# Patient Record
Sex: Female | Born: 1990 | Race: White | Hispanic: No | Marital: Single | Smoking: Never smoker
Health system: Southern US, Community
[De-identification: ages and names within clinical notes are randomized; demographics above are authoritative.]

## PROBLEM LIST (undated history)

## (undated) DIAGNOSIS — G43909 Migraine, unspecified, not intractable, without status migrainosus: Secondary | ICD-10-CM

## (undated) DIAGNOSIS — F419 Anxiety disorder, unspecified: Secondary | ICD-10-CM

## (undated) DIAGNOSIS — F5000 Anorexia nervosa, unspecified: Secondary | ICD-10-CM

## (undated) DIAGNOSIS — F32A Depression, unspecified: Secondary | ICD-10-CM

## (undated) DIAGNOSIS — F329 Major depressive disorder, single episode, unspecified: Secondary | ICD-10-CM

## (undated) HISTORY — DX: Anxiety disorder, unspecified: F41.9

## (undated) HISTORY — DX: Migraine, unspecified, not intractable, without status migrainosus: G43.909

## (undated) HISTORY — DX: Anorexia nervosa, unspecified: F50.00

---
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
## 2016-03-13 ENCOUNTER — Ambulatory Visit (INDEPENDENT_AMBULATORY_CARE_PROVIDER_SITE_OTHER): Payer: 59 | Admitting: Osteopathic Medicine

## 2016-03-13 ENCOUNTER — Encounter: Payer: Self-pay | Admitting: Osteopathic Medicine

## 2016-03-13 VITALS — BP 122/73 | HR 98 | Ht 64.0 in | Wt 125.0 lb

## 2016-03-13 DIAGNOSIS — F330 Major depressive disorder, recurrent, mild: Secondary | ICD-10-CM | POA: Diagnosis not present

## 2016-03-13 DIAGNOSIS — F411 Generalized anxiety disorder: Secondary | ICD-10-CM | POA: Insufficient documentation

## 2016-03-13 DIAGNOSIS — R002 Palpitations: Secondary | ICD-10-CM | POA: Insufficient documentation

## 2016-03-13 LAB — CBC WITH DIFFERENTIAL/PLATELET
BASOS ABS: 0 {cells}/uL (ref 0–200)
Basophils Relative: 0 %
Eosinophils Absolute: 43 cells/uL (ref 15–500)
Eosinophils Relative: 1 %
HEMATOCRIT: 42.2 % (ref 35.0–45.0)
HEMOGLOBIN: 14.2 g/dL (ref 11.7–15.5)
LYMPHS ABS: 1376 {cells}/uL (ref 850–3900)
LYMPHS PCT: 32 %
MCH: 30.1 pg (ref 27.0–33.0)
MCHC: 33.6 g/dL (ref 32.0–36.0)
MCV: 89.4 fL (ref 80.0–100.0)
MONO ABS: 473 {cells}/uL (ref 200–950)
MPV: 10 fL (ref 7.5–12.5)
Monocytes Relative: 11 %
NEUTROS PCT: 56 %
Neutro Abs: 2408 cells/uL (ref 1500–7800)
Platelets: 321 10*3/uL (ref 140–400)
RBC: 4.72 MIL/uL (ref 3.80–5.10)
RDW: 12.8 % (ref 11.0–15.0)
WBC: 4.3 10*3/uL (ref 3.8–10.8)

## 2016-03-13 LAB — TSH: TSH: 0.87 m[IU]/L

## 2016-03-13 MED ORDER — BUPROPION HCL ER (XL) 150 MG PO TB24: 150.0000 mg | ORAL_TABLET | Freq: Every day | ORAL | 1 refills | Status: DC

## 2016-03-13 MED ORDER — ALPRAZOLAM 0.5 MG PO TABS: 0.2500 mg | ORAL_TABLET | Freq: Every day | ORAL | 0 refills | Status: DC | PRN

## 2016-03-13 MED FILL — BUPROPION HCL XL 150 MG TAB: 150 | 30 days supply | Qty: 30 | Fill #0

## 2016-03-13 NOTE — Patient Instructions (Signed)
I'll try to reach you in 2 weeks by phone to discuss how you're doing on the medicine, and let's plan to follow-up in the office in 4 - 6 weeks. Any questions or concerns before then, please call or message me!   Take care! -Dr. Loni Muse.

## 2016-03-13 NOTE — Progress Notes (Signed)
HPI: Jennifer Levy is a 25 y.o. female  who presents to [facility] today, 03/13/16,  for chief complaint of:  Chief Complaint  Patient presents with  . Establish Care    anxiety    New patient here to establish care. Recently moved here for work after [place] in [place]. Has been here since January. Previous treatment with Xanax as needed, finds that she is using this about 3 times per week or so. Out of this medicine. Like to discuss options for anxiety/depression and [place]. Previous medications include the following: Seroquel - was on this when she was younger, not sue why it was stopped. Zoloft - problems with libido.   Generalized anxiety disorder evaluation: Excessive anxiety/worry 6 mos+: Yes  Home and work/school: Yes  Difficulty to control: Yes  Associated symptoms (3/adult, 1/child):     Restlessness/on edge Yes      Fatigue Yes      Concentration Yes      Irritable Yes      Muscle tension No      Sleep disturbance Yes  Imparled social/occupational functioning: Yes  Any secondary cause or other concerns?   Major depression Concern  Personality d/o No concern  Social anxiety d/o No concern  Obsessive Compulsive d/o No concern  PTSD No concern  Eating d/o or Body Dysmorphic d/o Concern - hx anorexia nervosa   Major Depression Evaluation: One of the following for 2wk, most of the day on most days?  Depression: Yes    Anhedonia/reduced interest: Yes  Four of the following, most of the day on most days?  Weight change unintentional: No   Sleep pattern change: Yes   Psychomotor symptom: No   Fatigue: Yes   Worthlessness/guilt: Yes   Concentration problems: Yes   Thoughts of death/suicide: Yes  Concern for other causes?  Drug-induced problems: No   Bipolar disorder: No - MDQ negative 03/13/16   Delusional: No   Hallucinations: No   Bereavement/grief: No   Hypothyroid: labs pending  Alcohol: pt denies    Past  medical, surgical, social and family history reviewed: Past Medical History:  Diagnosis Date  . Anorexia nervosa   . Anxiety   . Migraine    Past Surgical History:  Procedure Laterality Date  . TMJ ARTHROSCOPY  2012  . WISDOM TOOTH EXTRACTION  2011   Social History  Substance Use Topics  . Smoking status: Never Smoker  . Smokeless tobacco: Never Used  . Alcohol use Not on file   No family history on file.   Current medication list and allergy/intolerance information reviewed:   Current Outpatient Prescriptions  Medication Sig Dispense Refill  . ALPRAZolam (XANAX) 0.5 MG tablet Take 0.5-1 tablets (0.25-0.5 mg total) by mouth daily as needed for anxiety (use sparingly to prevent tolerance/dependence). #30 tablets for 75-month supply 30 tablet 0  . SPRINTEC 28 0.25-35 MG-MCG tablet Take 1 tablet by mouth daily.  8  . buPROPion (WELLBUTRIN XL) 150 MG 24 hr tablet Take 1 tablet (150 mg total) by mouth daily. 30 tablet 1   No current facility-administered medications for this visit.    Allergies not on file    Review of Systems:  Constitutional:  No  fever, no chills, No recent illness, No unintentional weight changes. No significant fatigue.   HEENT: +occasional headache, no vision change, no hearing change, No sore throat, No  sinus pressure  Cardiac: No  chest pain, No  pressure, +occasional palpitations, No  Orthopnea  Respiratory:  No  shortness of breath. No  Cough  Gastrointestinal: No  abdominal pain, No  nausea, No  vomiting,  No  blood in stool, No  diarrhea, No  constipation   Musculoskeletal: No new myalgia/arthralgia  Genitourinary: No  incontinence, No  abnormal genital bleeding, No abnormal genital discharge  Skin: No  Rash, No other wounds/concerning lesions  Hem/Onc: No  easy bruising/bleeding, No  abnormal lymph node  Endocrine: No cold intolerance,  No heat intolerance. No polyuria/polydipsia/polyphagia   Neurologic: No  weakness, No  dizziness, No   slurred speech/focal weakness/facial droop  Psychiatric: +concerns with depression, +concerns with anxiety, +sleep problems, No mood problems  Exam:  BP 122/73   Pulse 98   Ht 5\' 4"  (D34-534 m)   Wt 125 lb (56.7 kg)   BMI 21.46 kg/m   Constitutional: VS see above. General Appearance: alert, well-developed, well-nourished, NAD  Eyes: Normal lids and conjunctive, non-icteric sclera  Ears, Nose, Mouth, Throat: MMM, Normal external inspection ears/nares/mouth/lips/gums.   Neck: No masses, trachea midline. No thyroid enlargement. No tenderness/mass appreciated. No lymphadenopathy  Respiratory: Normal respiratory effort. no wheeze, no rhonchi, no rales  Cardiovascular: S1/S2 normal, no murmur, no rub/gallop auscultated. RRR. No lower extremity edema.  Gastrointestinal: Nontender, no masses. No hepatomegaly, no splenomegaly. No hernia appreciated. Bowel sounds normal. Rectal exam deferred.   Musculoskeletal: Gait normal.   Neurological: Normal balance/coordination. No tremor.   Skin: warm, dry, intact. No rash/ulcer.   Psychiatric: Normal judgment/insight. Normal mood and affect. Oriented x3. No suicidal ideation. No thought disorder.   Depression screen PHQ 2/9 03/13/2016  Decreased Interest 1  Down, Depressed, Hopeless 1  PHQ - 2 Score 2  Altered sleeping 2  Tired, decreased energy 1  Change in appetite 0  Feeling bad or failure about yourself  2  Trouble concentrating 1  Moving slowly or fidgety/restless 0  Suicidal thoughts 1  PHQ-9 Score 9   GAD 7 : Generalized Anxiety Score 03/13/2016  Nervous, Anxious, on Edge 2  Control/stop worrying 1  Worry too much - different things 1  Trouble relaxing 2  Restless 1  Easily annoyed or irritable 1  Afraid - awful might happen 2  Total GAD 7 Score 10    EKG interpretation: Rate: 90  Rhythm: sinus No ST/T changes concerning for acute ischemia/infarct    ASSESSMENT/PLAN:   No concerns on EKG, palpitations with anxiety  type symptoms.  Given previous side effects on SSRI and patient concern for weight gain, will trial Wellbutrin.  Patient has been educated on significant possible side effects of medication and is instructed to contact me or other medical professional with any concerns about side effects.    Generalized anxiety disorder - Plan: CBC with Differential/Platelet, COMPLETE METABOLIC PANEL WITH GFR, TSH, buPROPion (WELLBUTRIN XL) 150 MG 24 hr tablet, ALPRAZolam (XANAX) 0.5 MG tablet  Mild episode of recurrent major depressive disorder ([place]) - Plan: buPROPion (WELLBUTRIN XL) 150 MG 24 hr tablet  Intermittent palpitations    Patient Instructions  I'll try to reach you in 2 weeks by phone to discuss how you're doing on the medicine, and let's plan to follow-up in the office in 4 - 6 weeks. Any questions or concerns before then, please call or message me!   Take care! -Dr. Loni Muse.    Visit summary with medication list and pertinent instructions was printed for patient to review. All questions at time of visit were answered - patient instructed to contact office with  any additional concerns. ER/RTC precautions were reviewed with the patient. Follow-up plan: Return in about 6 weeks (around 04/24/2016) for FOLLOW-UP ON ANXIETY/MEDICATIONS, sooner if needed.

## 2016-03-14 LAB — COMPLETE METABOLIC PANEL WITH GFR
ALBUMIN: 4.1 g/dL (ref 3.6–5.1)
ALK PHOS: 46 U/L (ref 33–115)
ALT: 19 U/L (ref 6–29)
AST: 15 U/L (ref 10–30)
BUN: 10 mg/dL (ref 7–25)
CALCIUM: 9 mg/dL (ref 8.6–10.2)
CO2: 22 mmol/L (ref 20–31)
Chloride: 105 mmol/L (ref 98–110)
Creat: 0.68 mg/dL (ref 0.50–1.10)
Glucose, Bld: 92 mg/dL (ref 65–99)
POTASSIUM: 4.3 mmol/L (ref 3.5–5.3)
Sodium: 137 mmol/L (ref 135–146)
Total Bilirubin: 0.5 mg/dL (ref 0.2–1.2)
Total Protein: 6.5 g/dL (ref 6.1–8.1)

## 2016-03-15 NOTE — Addendum Note (Signed)
Addended by: Huel Cote on: 03/15/2016 04:51 PM   Modules accepted: Orders

## 2016-03-16 MED FILL — ALPRAZolam 0.5 MG TABS: 0.5 | 90 days supply | Qty: 30 | Fill #0

## 2016-03-18 ENCOUNTER — Encounter: Payer: Self-pay | Admitting: Osteopathic Medicine

## 2016-04-17 MED FILL — BUPROPION HCL XL 150 MG TAB: 150 | 30 days supply | Qty: 30 | Fill #1

## 2016-04-18 ENCOUNTER — Encounter: Payer: Self-pay | Admitting: Osteopathic Medicine

## 2016-04-18 ENCOUNTER — Ambulatory Visit (INDEPENDENT_AMBULATORY_CARE_PROVIDER_SITE_OTHER): Payer: 59 | Admitting: Osteopathic Medicine

## 2016-04-18 DIAGNOSIS — F411 Generalized anxiety disorder: Secondary | ICD-10-CM | POA: Diagnosis not present

## 2016-04-18 DIAGNOSIS — F330 Major depressive disorder, recurrent, mild: Secondary | ICD-10-CM | POA: Diagnosis not present

## 2016-04-18 MED ORDER — BUPROPION HCL ER (XL) 150 MG PO TB24: 150.0000 mg | ORAL_TABLET | Freq: Every day | ORAL | 1 refills | Status: DC

## 2016-04-18 NOTE — Patient Instructions (Signed)
Let me know what dose you'd like to continue - can try going up to 300 mg or stay at 150 mg - message or call me!

## 2016-04-18 NOTE — Progress Notes (Signed)
HPI: Jennifer Levy is a 25 y.o. female  who presents to [facility] today, 04/18/16,  for chief complaint of:  No chief complaint on file.   Established care 03/13/16 - Recently moved here for work after [place] in [place]. Has been here since January. Previous treatment with Xanax as needed, finds that she is using this about 3 times per week or so. Out of this medicine. Like to discuss options for anxiety/depression. Previous medications include the following: Seroquel - was on this when she was younger, not sure why it was stopped. Zoloft - problems with libido. MDQ negative at that visit.  Interim history: 04/18/16 last visit refilled Xanax #30 x3 months. Initiated Wellbutrin, Pt was experiencing some eye twitches shortly after starting the medication but this resolved spontaneously. Patient showed improvement in self-report of symptoms, see below for anxiety/depression scales GAD7 & PHQ9, she is finding that she is needing the Xanax a bit less frequently. Tolerating the medication better than she was able to take Seroquel and Zoloft in the past.    Past medical, surgical, social and family history reviewed: Past Medical History:  Diagnosis Date  . Anorexia nervosa   . Anxiety   . Migraine    Past Surgical History:  Procedure Laterality Date  . TMJ ARTHROSCOPY  2012  . WISDOM TOOTH EXTRACTION  2011   Social History  Substance Use Topics  . Smoking status: Never Smoker  . Smokeless tobacco: Never Used  . Alcohol use Not on file   Family History  Problem Relation Age of Onset  . Depression Mother   . Alcohol abuse Brother   . Stroke Maternal Aunt   . Alcohol abuse Maternal Uncle   . Cancer Maternal Grandfather     breast  . Diabetes Maternal Grandfather      Current medication list and allergy/intolerance information reviewed:   Current Outpatient Prescriptions  Medication Sig Dispense Refill  . ALPRAZolam (XANAX) 0.5 MG  tablet Take 0.5-1 tablets (0.25-0.5 mg total) by mouth daily as needed for anxiety (use sparingly to prevent tolerance/dependence). #30 tablets for 12-month supply 30 tablet 0  . buPROPion (WELLBUTRIN XL) 150 MG 24 hr tablet Take 1 tablet (150 mg total) by mouth daily. 30 tablet 1  . SPRINTEC 28 0.25-35 MG-MCG tablet Take 1 tablet by mouth daily.  8   No current facility-administered medications for this visit.    Allergies not on file    Review of Systems:  Constitutional:  No  fever, no chills, No recent illness, No unintentional weight changes. No significant fatigue.   Cardiac: No  chest pain, No  pressure,  Respiratory:  No  shortness of breath.   Gastrointestinal: No  abdominal pain, No  nausea,   Neurologic: No  weakness, No  dizziness, No  slurred speech/focal weakness/facial droop  Psychiatric: No concerns with depression, No concerns with anxiety, No sleep problems, No mood problems  Exam:  BP 111/68   Pulse 75   Ht 5\' 4"  (1.626 m)   Wt 123 lb (55.8 kg)   BMI 21.11 kg/m   Constitutional: VS see above. General Appearance: alert, well-developed, well-nourished, NAD  Ears, Nose, Mouth, Throat: MMM.   Neck: No masses, trachea midline.  Respiratory: Normal respiratory effort  Musculoskeletal: Gait normal.   Neurological: Normal balance/coordination. No tremor.   Psychiatric: Normal judgment/insight. Normal mood and affect. Oriented x3. No suicidal ideation. No thought disorder.   Depression screen [facility] 2/9 04/18/2016 03/13/2016  Decreased  Interest 1 1  Down, Depressed, Hopeless 1 1  PHQ - 2 Score 2 2  Altered sleeping 1 2  Tired, decreased energy 1 1  Change in appetite 1 0  Feeling bad or failure about yourself  1 2  Trouble concentrating 0 1  Moving slowly or fidgety/restless 0 0  Suicidal thoughts 1 1  PHQ-9 Score 7 9   GAD 7 : Generalized Anxiety Score 04/18/2016 03/13/2016  Nervous, Anxious, on Edge 2 2  Control/stop worrying 2 1  Worry too much -  different things 1 1  Trouble relaxing 1 2  Restless 1 1  Easily annoyed or irritable 0 1  Afraid - awful might happen 1 2  Total GAD 7 Score 8 10  Anxiety Difficulty Somewhat difficult -     ASSESSMENT/PLAN:   Doing well on Wellbutrin, finding that she is needing the alprazolam a bit less. Uncertain whether she would like to increase the dose of the Wellbutrin to 300 at this time.   Generalized anxiety disorder - Plan: buPROPion (WELLBUTRIN XL) 150 MG 24 hr tablet  Mild episode of recurrent major depressive disorder ([place]) - Plan: buPROPion (WELLBUTRIN XL) 150 MG 24 hr tablet    Patient Instructions  Let me know what dose you'd like to continue - can try going up to 300 mg or stay at 150 mg - message or call me!     Visit summary with medication list and pertinent instructions was printed for patient to review. All questions at time of visit were answered - patient instructed to contact office with any additional concerns. ER/RTC precautions were reviewed with the patient. Follow-up plan: Return in about 1 year (around 04/18/2017) for [place] REFILLS, sooner if needed.

## 2016-04-26 ENCOUNTER — Encounter: Payer: Self-pay | Admitting: Osteopathic Medicine

## 2016-05-05 ENCOUNTER — Encounter: Payer: Self-pay | Admitting: Osteopathic Medicine

## 2016-05-12 MED FILL — BUPROPION HCL XL 150 MG TAB: 150 | 30 days supply | Qty: 30 | Fill #0

## 2016-06-25 ENCOUNTER — Other Ambulatory Visit: Payer: Self-pay | Admitting: Osteopathic Medicine

## 2016-06-25 DIAGNOSIS — F411 Generalized anxiety disorder: Secondary | ICD-10-CM

## 2016-06-26 MED ORDER — ALPRAZOLAM 0.5 MG PO TABS: 0.2500 mg | ORAL_TABLET | Freq: Every day | ORAL | 0 refills | Status: DC | PRN

## 2016-06-28 ENCOUNTER — Telehealth: Payer: Self-pay | Admitting: Osteopathic Medicine

## 2016-07-18 DIAGNOSIS — F5001 Anorexia nervosa, restricting type: Secondary | ICD-10-CM | POA: Diagnosis not present

## 2016-07-18 DIAGNOSIS — F411 Generalized anxiety disorder: Secondary | ICD-10-CM | POA: Diagnosis not present

## 2016-07-18 DIAGNOSIS — F341 Dysthymic disorder: Secondary | ICD-10-CM | POA: Diagnosis not present

## 2016-07-27 ENCOUNTER — Encounter: Payer: Self-pay | Admitting: Osteopathic Medicine

## 2016-07-28 MED ORDER — SPRINTEC 28 0.25-35 MG-MCG PO TABS: 1.0000 | ORAL_TABLET | Freq: Every day | ORAL | 3 refills | Status: DC

## 2016-07-28 MED FILL — MONO-LINYAH 28 TABLET: 0.25-35 | 84 days supply | Qty: 84 | Fill #0

## 2016-08-20 ENCOUNTER — Encounter: Payer: Self-pay | Admitting: Emergency Medicine

## 2016-08-20 ENCOUNTER — Emergency Department
Admission: EM | Admit: 2016-08-20 | Discharge: 2016-08-20 | Disposition: A | Discharge status: Home / Self Care | Payer: 59 | Source: Home / Self Care | Attending: Family Medicine | Admitting: Family Medicine

## 2016-08-20 DIAGNOSIS — B9789 Other viral agents as the cause of diseases classified elsewhere: Secondary | ICD-10-CM | POA: Diagnosis not present

## 2016-08-20 DIAGNOSIS — J069 Acute upper respiratory infection, unspecified: Secondary | ICD-10-CM

## 2016-08-20 HISTORY — DX: Depression, unspecified: F32.A

## 2016-08-20 HISTORY — DX: Major depressive disorder, single episode, unspecified: F32.9

## 2016-08-20 LAB — POCT RAPID STREP A (OFFICE): Rapid Strep A Screen: NEGATIVE

## 2016-08-20 LAB — POCT INFLUENZA A/B
INFLUENZA A, POC: NEGATIVE
INFLUENZA B, POC: NEGATIVE

## 2016-08-20 MED ORDER — GUAIFENESIN-CODEINE 100-10 MG/5ML PO SOLN: ORAL | 0 refills | Status: DC

## 2016-08-20 NOTE — ED Provider Notes (Signed)
[name]    CSN: [id number] Arrival date & time: 08/20/16  1643     History   Chief Complaint Chief Complaint  Patient presents with  . Sore Throat    HPI Jennifer Levy is a 26 y.o. female.   Patient awoke yesterday with flu-like illness including myalgias, headache, fever to 100.3/chills, fatigue, sore throat, and cough.  Cough is non-productive and somewhat worse at night.  No pleuritic pain or shortness of breath.     The history is provided by the patient.    Past Medical History:  Diagnosis Date  . Anorexia nervosa   . Anxiety   . Depression   . Migraine     Patient Active Problem List   Diagnosis Date Noted  . Generalized anxiety disorder 03/13/2016  . Mild episode of recurrent major depressive disorder ([place]) 03/13/2016  . Intermittent palpitations 03/13/2016    Past Surgical History:  Procedure Laterality Date  . TMJ ARTHROSCOPY  2012  . WISDOM TOOTH EXTRACTION  2011    OB History    No data available       Home Medications    Prior to Admission medications   Medication Sig Start Date End Date Taking? Authorizing Provider  ALPRAZolam Duanne Moron) 0.5 MG tablet Take 0.5-1 tablets (0.25-0.5 mg total) by mouth daily as needed for anxiety (use sparingly to prevent tolerance/dependence). #30 tablets for 42-month supply 06/26/16   Emeterio Reeve, DO  guaiFENesin-codeine 100-10 MG/5ML syrup Take 20mL by mouth at bedtime as needed for cough 08/20/16   Kandra Nicolas, MD  SPRINTEC 28 0.25-35 MG-MCG tablet Take 1 tablet by mouth daily. 07/28/16   Emeterio Reeve, DO    Family History Family History  Problem Relation Age of Onset  . Depression Mother   . Alcohol abuse Brother   . Stroke Maternal Aunt   . Alcohol abuse Maternal Uncle   . Cancer Maternal Grandfather     breast  . Diabetes Maternal Grandfather     Social History Social History  Substance Use Topics  . Smoking status: Never Smoker  . Smokeless tobacco: Never Used    . Alcohol use 0.6 oz/week    1 Standard drinks or equivalent per week     Allergies   Patient has no known allergies.   Review of Systems Review of Systems + sore throat + cough No pleuritic pain No wheezing No nasal congestion ? post-nasal drainage No sinus pain/pressure No itchy/red eyes No earache No hemoptysis No SOB + fever, + chills No nausea No vomiting No abdominal pain No diarrhea No urinary symptoms No skin rash + fatigue + myalgias + headache Used OTC meds without relief   Physical Exam Triage Vital Signs ED Triage Vitals [08/20/16 1731]  Enc Vitals Group     BP 118/82     Pulse Rate (!) 109     Resp      Temp 98.2 F (36.8 C)     Temp Source Oral     SpO2 97 %     Weight 129 lb 8 oz (58.7 kg)     Height 5\' 4"  (1.626 m)     Head Circumference      Peak Flow      Pain Score 0     Pain Loc      Pain Edu?      Excl. in [place]?    No data found.   Updated Vital Signs BP 118/82 (BP Location: Left Arm)  Pulse (!) 109   Temp 98.2 F (36.8 C) (Oral)   Ht 5\' 4"  (1.626 m)   Wt 129 lb 8 oz (58.7 kg)   LMP 08/19/2016 (Exact Date)   SpO2 97%   BMI 22.23 kg/m   Visual Acuity Right Eye Distance:   Left Eye Distance:   Bilateral Distance:    Right Eye Near:   Left Eye Near:    Bilateral Near:     Physical Exam Nursing notes and Vital Signs reviewed. Appearance:  Patient appears stated age, and in no acute distress Eyes:  Pupils are equal, round, and reactive to light and accomodation.  Extraocular movement is intact.  Conjunctivae are not inflamed  Ears:  Canals normal.  Tympanic membranes normal.  Nose:  Mildly congested turbinates.  No sinus tenderness.   Pharynx:  Normal Neck:  Supple.  Tender enlarged posterior/lateral nodes are palpated bilaterally  Lungs:  Clear to auscultation.  Breath sounds are equal.  Moving air well. Heart:  Regular rate and rhythm without murmurs, rubs, or gallops.  Abdomen:  Nontender without masses or  hepatosplenomegaly.  Bowel sounds are present.  No CVA or flank tenderness.  Extremities:  No edema.  Skin:  No rash present.    UC Treatments / Results  Labs (all labs ordered are listed, but only abnormal results are displayed) Labs Reviewed  POCT RAPID STREP A (OFFICE) negative  POCT INFLUENZA A/B negative    EKG  EKG Interpretation None       Radiology No results found.  Procedures Procedures (including critical care time)  Medications Ordered in UC Medications - No data to display   Initial Impression / Assessment and Plan / UC Course  I have reviewed the triage vital signs and the nursing notes.  Pertinent labs & imaging results that were available during my care of the patient were reviewed by me and considered in my medical decision making (see chart for details).    There is no evidence of bacterial infection today.   Rx for Robitussin AC for night time cough.  Take plain guaifenesin (1200mg  extended release tabs such as Mucinex) twice daily, with plenty of water, for cough and congestion.  May add Pseudoephedrine (30mg , one or two every 4 to 6 hours) for sinus congestion.  Get adequate rest.   May use Afrin nasal spray (or generic oxymetazoline) each morning for about 5 days and then discontinue.  Also recommend using saline nasal spray several times daily and saline nasal irrigation (AYR is a common brand).   Try warm salt water gargles for sore throat.  Stop all antihistamines for now, and other non-prescription cough/cold preparations. May take Ibuprofen 200mg , 4 tabs every 8 hours with food for headache, body aches, etc. Followup with Family Doctor if not improved in about 8 days.   Final Clinical Impressions(s) / UC Diagnoses   Final diagnoses:  Viral URI with cough    New Prescriptions New Prescriptions   GUAIFENESIN-CODEINE 100-10 MG/5ML SYRUP    Take 27mL by mouth at bedtime as needed for cough     Kandra Nicolas, MD 08/23/16 [phone]

## 2016-08-20 NOTE — Discharge Instructions (Signed)
Take plain guaifenesin (1200mg  extended release tabs such as Mucinex) twice daily, with plenty of water, for cough and congestion.  May add Pseudoephedrine (30mg , one or two every 4 to 6 hours) for sinus congestion.  Get adequate rest.   May use Afrin nasal spray (or generic oxymetazoline) each morning for about 5 days and then discontinue.  Also recommend using saline nasal spray several times daily and saline nasal irrigation (AYR is a common brand).   Try warm salt water gargles for sore throat.  Stop all antihistamines for now, and other non-prescription cough/cold preparations. May take Ibuprofen 200mg , 4 tabs every 8 hours with food for headache, body aches, etc.

## 2016-08-20 NOTE — ED Triage Notes (Signed)
Pt c/o sore throat x 1 day, productive cough, low grade fever last night, no ear pain.

## 2016-08-30 ENCOUNTER — Ambulatory Visit: Payer: 59 | Admitting: Osteopathic Medicine

## 2016-10-06 ENCOUNTER — Other Ambulatory Visit: Payer: Self-pay | Admitting: Osteopathic Medicine

## 2016-10-06 DIAGNOSIS — F411 Generalized anxiety disorder: Secondary | ICD-10-CM

## 2016-10-06 MED ORDER — ALPRAZOLAM 0.5 MG PO TABS: 0.2500 mg | ORAL_TABLET | Freq: Every day | ORAL | 0 refills | Status: DC | PRN

## 2016-10-19 MED FILL — MONO-LINYAH 28 TABLET: 0.25-35 | 84 days supply | Qty: 84 | Fill #1

## 2016-10-19 MED FILL — ALPRAZolam 0.5 MG TABS: 0.5 | 90 days supply | Qty: 30 | Fill #0

## 2016-12-07 DIAGNOSIS — F509 Eating disorder, unspecified: Secondary | ICD-10-CM | POA: Diagnosis not present

## 2016-12-07 DIAGNOSIS — F438 Other reactions to severe stress: Secondary | ICD-10-CM | POA: Diagnosis not present

## 2016-12-07 DIAGNOSIS — F411 Generalized anxiety disorder: Secondary | ICD-10-CM | POA: Diagnosis not present

## 2016-12-07 DIAGNOSIS — F331 Major depressive disorder, recurrent, moderate: Secondary | ICD-10-CM | POA: Diagnosis not present

## 2016-12-18 DIAGNOSIS — F411 Generalized anxiety disorder: Secondary | ICD-10-CM | POA: Diagnosis not present

## 2016-12-18 DIAGNOSIS — F438 Other reactions to severe stress: Secondary | ICD-10-CM | POA: Diagnosis not present

## 2016-12-18 DIAGNOSIS — F509 Eating disorder, unspecified: Secondary | ICD-10-CM | POA: Diagnosis not present

## 2016-12-18 DIAGNOSIS — F331 Major depressive disorder, recurrent, moderate: Secondary | ICD-10-CM | POA: Diagnosis not present

## 2017-01-01 DIAGNOSIS — F411 Generalized anxiety disorder: Secondary | ICD-10-CM | POA: Diagnosis not present

## 2017-01-01 DIAGNOSIS — F438 Other reactions to severe stress: Secondary | ICD-10-CM | POA: Diagnosis not present

## 2017-01-01 DIAGNOSIS — F509 Eating disorder, unspecified: Secondary | ICD-10-CM | POA: Diagnosis not present

## 2017-01-01 DIAGNOSIS — F331 Major depressive disorder, recurrent, moderate: Secondary | ICD-10-CM | POA: Diagnosis not present

## 2017-01-08 DIAGNOSIS — F438 Other reactions to severe stress: Secondary | ICD-10-CM | POA: Diagnosis not present

## 2017-01-08 DIAGNOSIS — F411 Generalized anxiety disorder: Secondary | ICD-10-CM | POA: Diagnosis not present

## 2017-01-08 DIAGNOSIS — F331 Major depressive disorder, recurrent, moderate: Secondary | ICD-10-CM | POA: Diagnosis not present

## 2017-01-08 DIAGNOSIS — F509 Eating disorder, unspecified: Secondary | ICD-10-CM | POA: Diagnosis not present

## 2017-01-15 DIAGNOSIS — F331 Major depressive disorder, recurrent, moderate: Secondary | ICD-10-CM | POA: Diagnosis not present

## 2017-01-15 DIAGNOSIS — F411 Generalized anxiety disorder: Secondary | ICD-10-CM | POA: Diagnosis not present

## 2017-01-15 DIAGNOSIS — F438 Other reactions to severe stress: Secondary | ICD-10-CM | POA: Diagnosis not present

## 2017-01-15 DIAGNOSIS — F509 Eating disorder, unspecified: Secondary | ICD-10-CM | POA: Diagnosis not present

## 2017-01-22 DIAGNOSIS — F331 Major depressive disorder, recurrent, moderate: Secondary | ICD-10-CM | POA: Diagnosis not present

## 2017-01-22 DIAGNOSIS — F411 Generalized anxiety disorder: Secondary | ICD-10-CM | POA: Diagnosis not present

## 2017-01-22 DIAGNOSIS — F438 Other reactions to severe stress: Secondary | ICD-10-CM | POA: Diagnosis not present

## 2017-01-22 DIAGNOSIS — F509 Eating disorder, unspecified: Secondary | ICD-10-CM | POA: Diagnosis not present

## 2017-01-31 MED FILL — MONO-LINYAH 28 TABLET: 0.25-35 | 84 days supply | Qty: 84 | Fill #2

## 2017-02-07 DIAGNOSIS — F509 Eating disorder, unspecified: Secondary | ICD-10-CM | POA: Diagnosis not present

## 2017-02-07 DIAGNOSIS — F331 Major depressive disorder, recurrent, moderate: Secondary | ICD-10-CM | POA: Diagnosis not present

## 2017-02-07 DIAGNOSIS — F438 Other reactions to severe stress: Secondary | ICD-10-CM | POA: Diagnosis not present

## 2017-02-07 DIAGNOSIS — F411 Generalized anxiety disorder: Secondary | ICD-10-CM | POA: Diagnosis not present

## 2017-02-14 DIAGNOSIS — F331 Major depressive disorder, recurrent, moderate: Secondary | ICD-10-CM | POA: Diagnosis not present

## 2017-02-14 DIAGNOSIS — F509 Eating disorder, unspecified: Secondary | ICD-10-CM | POA: Diagnosis not present

## 2017-02-14 DIAGNOSIS — F411 Generalized anxiety disorder: Secondary | ICD-10-CM | POA: Diagnosis not present

## 2017-02-14 DIAGNOSIS — F438 Other reactions to severe stress: Secondary | ICD-10-CM | POA: Diagnosis not present

## 2017-02-19 DIAGNOSIS — F411 Generalized anxiety disorder: Secondary | ICD-10-CM | POA: Diagnosis not present

## 2017-02-19 DIAGNOSIS — F331 Major depressive disorder, recurrent, moderate: Secondary | ICD-10-CM | POA: Diagnosis not present

## 2017-02-19 DIAGNOSIS — F509 Eating disorder, unspecified: Secondary | ICD-10-CM | POA: Diagnosis not present

## 2017-02-19 DIAGNOSIS — F438 Other reactions to severe stress: Secondary | ICD-10-CM | POA: Diagnosis not present

## 2017-02-28 DIAGNOSIS — F331 Major depressive disorder, recurrent, moderate: Secondary | ICD-10-CM | POA: Diagnosis not present

## 2017-02-28 DIAGNOSIS — F411 Generalized anxiety disorder: Secondary | ICD-10-CM | POA: Diagnosis not present

## 2017-02-28 DIAGNOSIS — F509 Eating disorder, unspecified: Secondary | ICD-10-CM | POA: Diagnosis not present

## 2017-02-28 DIAGNOSIS — F438 Other reactions to severe stress: Secondary | ICD-10-CM | POA: Diagnosis not present

## 2017-05-04 ENCOUNTER — Encounter: Payer: Self-pay | Admitting: Osteopathic Medicine

## 2017-05-04 DIAGNOSIS — Z0189 Encounter for other specified special examinations: Secondary | ICD-10-CM

## 2017-05-07 DIAGNOSIS — F509 Eating disorder, unspecified: Secondary | ICD-10-CM | POA: Diagnosis not present

## 2017-05-07 DIAGNOSIS — F438 Other reactions to severe stress: Secondary | ICD-10-CM | POA: Diagnosis not present

## 2017-05-07 DIAGNOSIS — F331 Major depressive disorder, recurrent, moderate: Secondary | ICD-10-CM | POA: Diagnosis not present

## 2017-05-07 DIAGNOSIS — F411 Generalized anxiety disorder: Secondary | ICD-10-CM | POA: Diagnosis not present

## 2017-05-18 DIAGNOSIS — F438 Other reactions to severe stress: Secondary | ICD-10-CM | POA: Diagnosis not present

## 2017-05-18 DIAGNOSIS — F331 Major depressive disorder, recurrent, moderate: Secondary | ICD-10-CM | POA: Diagnosis not present

## 2017-05-18 DIAGNOSIS — F509 Eating disorder, unspecified: Secondary | ICD-10-CM | POA: Diagnosis not present

## 2017-05-18 DIAGNOSIS — F411 Generalized anxiety disorder: Secondary | ICD-10-CM | POA: Diagnosis not present

## 2017-06-01 DIAGNOSIS — F509 Eating disorder, unspecified: Secondary | ICD-10-CM | POA: Diagnosis not present

## 2017-06-01 DIAGNOSIS — F438 Other reactions to severe stress: Secondary | ICD-10-CM | POA: Diagnosis not present

## 2017-06-01 DIAGNOSIS — F411 Generalized anxiety disorder: Secondary | ICD-10-CM | POA: Diagnosis not present

## 2017-06-01 DIAGNOSIS — F331 Major depressive disorder, recurrent, moderate: Secondary | ICD-10-CM | POA: Diagnosis not present

## 2017-09-10 ENCOUNTER — Other Ambulatory Visit: Payer: Self-pay | Admitting: Osteopathic Medicine

## 2017-09-10 DIAGNOSIS — F411 Generalized anxiety disorder: Secondary | ICD-10-CM

## 2017-09-26 ENCOUNTER — Encounter: Payer: 59 | Admitting: Osteopathic Medicine

## 2017-09-28 ENCOUNTER — Ambulatory Visit (INDEPENDENT_AMBULATORY_CARE_PROVIDER_SITE_OTHER): Payer: 59 | Admitting: Osteopathic Medicine

## 2017-09-28 ENCOUNTER — Encounter: Payer: Self-pay | Admitting: Osteopathic Medicine

## 2017-09-28 VITALS — BP 115/65 | HR 94 | Temp 98.6°F | Wt 131.2 lb

## 2017-09-28 DIAGNOSIS — Z23 Encounter for immunization: Secondary | ICD-10-CM | POA: Diagnosis not present

## 2017-09-28 DIAGNOSIS — Z0000 Encounter for general adult medical examination without abnormal findings: Secondary | ICD-10-CM

## 2017-09-28 DIAGNOSIS — Z30015 Encounter for initial prescription of vaginal ring hormonal contraceptive: Secondary | ICD-10-CM | POA: Diagnosis not present

## 2017-09-28 DIAGNOSIS — F418 Other specified anxiety disorders: Secondary | ICD-10-CM

## 2017-09-28 LAB — POCT URINE PREGNANCY: Preg Test, Ur: NEGATIVE

## 2017-09-28 MED ORDER — ALPRAZOLAM 0.5 MG PO TABS: 0.2500 mg | ORAL_TABLET | Freq: Every day | ORAL | 1 refills | Status: DC | PRN

## 2017-09-28 MED ORDER — ETONOGESTREL-ETHINYL ESTRADIOL 0.12-0.015 MG/24HR VA RING: VAGINAL_RING | VAGINAL | 3 refills | Status: DC

## 2017-09-28 MED FILL — NUVARING VAGINAL RING: 0.12-0.015 | 84 days supply | Qty: 3 | Fill #0

## 2017-09-28 MED FILL — ALPRAZolam 0.5 MG TABS: 0.5 | 30 days supply | Qty: 30 | Fill #0

## 2017-09-28 NOTE — Progress Notes (Signed)
HPI: Jennifer Levy is a 27 y.o. female who  has a past medical history of Anorexia nervosa, Anxiety, Depression, and Migraine.  she presents to [facility] today, 09/28/17,  for chief complaint of: Annual Check-up    Patient here for annual physical / wellness exam.  See preventive care reviewed as below.  Recent labs reviewed in detail with the patient.   Additional concerns today include:   Would like to get on NuvaRing for contraception and irregular periods, never been on this before, LMP 2 mos ago has been off OCP for a few months   Request refill Xanax, doing well with sparing use of this medicine   Results for orders placed or performed in visit on 09/28/17 (from the past 24 hour(s))  POCT urine pregnancy     Status: None   Collection Time: 09/28/17 12:06 PM  Result Value Ref Range   Preg Test, Ur Negative Negative      Past medical, surgical, social and family history reviewed:  Patient Active Problem List   Diagnosis Date Noted  . Generalized anxiety disorder 03/13/2016  . Mild episode of recurrent major depressive disorder ([place]) 03/13/2016  . Intermittent palpitations 03/13/2016    Past Surgical History:  Procedure Laterality Date  . TMJ ARTHROSCOPY  2012  . WISDOM TOOTH EXTRACTION  2011    Social History   Tobacco Use  . Smoking status: Never Smoker  . Smokeless tobacco: Never Used  Substance Use Topics  . Alcohol use: Yes    Alcohol/week: 0.6 oz    Types: 1 Standard drinks or equivalent per week    Family History  Problem Relation Age of Onset  . Depression Mother   . Alcohol abuse Brother   . Stroke Maternal Aunt   . Alcohol abuse Maternal Uncle   . Cancer Maternal Grandfather        breast  . Diabetes Maternal Grandfather      Current medication list and allergy/intolerance information reviewed:    Current Outpatient Medications  Medication Sig Dispense Refill  . ALPRAZolam (XANAX) 0.5 MG tablet  Take 0.5-1 tablets (0.25-0.5 mg total) by mouth daily as needed for anxiety (use sparingly to prevent tolerance/dependence). #30 tablets for 66-month supply 30 tablet 0   No current facility-administered medications for this visit.     No Known Allergies    Review of Systems:  Constitutional:  No  fever, no chills, No recent illness, No unintentional weight changes. No significant fatigue.   HEENT: No  headache, no vision change, no hearing change, No sore throat, No  sinus pressure  Cardiac: No  chest pain, No  pressure, No palpitations  Respiratory:  No  shortness of breath. No  Cough  Gastrointestinal: No  abdominal pain, No  nausea, No  vomiting,  No  blood in stool, No  diarrhea, No  constipation   Musculoskeletal: No new myalgia/arthralgia  Skin: No  Rash  Genitourinary: No  incontinence, No  abnormal genital bleeding, No abnormal genital discharge, irregular periods when off birth control   Hem/Onc: No  easy bruising/bleeding  Neurologic: No  weakness, No  dizziness  Psychiatric: No  concerns with depression, No  concerns with anxiety, No sleep problems, No mood problems  Exam:  BP 115/65 (BP Location: Left Arm, Patient Position: Sitting, Cuff Size: Normal)   Pulse 94   Temp 98.6 F (37 C) (Oral)   Wt 131 lb 3.2 oz (59.5 kg)   BMI 22.52 kg/m  Constitutional: VS see above. General Appearance: alert, well-developed, well-nourished, NAD  Eyes: Normal lids and conjunctive, non-icteric sclera  Ears, Nose, Mouth, Throat: MMM, Normal external inspection ears/nares/mouth/lips/gums. TM normal bilaterally. Pharynx/tonsils no erythema, no exudate. Nasal mucosa normal.   Neck: No masses, trachea midline. No thyroid enlargement. No tenderness/mass appreciated. No lymphadenopathy  Respiratory: Normal respiratory effort. no wheeze, no rhonchi, no rales  Cardiovascular: S1/S2 normal, no murmur, no rub/gallop auscultated. RRR. No lower extremity edema.   Gastrointestinal:  Nontender, no masses. No hepatomegaly, no splenomegaly. No hernia appreciated. Bowel sounds normal. Rectal exam deferred.   Musculoskeletal: Gait normal. No clubbing/cyanosis of digits.   Neurological: Normal balance/coordination. No tremor.   Skin: warm, dry, intact. No rash/ulcer. No concerning nevi or subq nodules on limited exam.    Psychiatric: Normal judgment/insight. Normal mood and affect. Oriented x3.     ASSESSMENT/PLAN:   Annual physical exam - Plan: CBC, COMPLETE METABOLIC PANEL WITH GFR, Lipid panel  Need for Tdap vaccination - Plan: Tdap vaccine greater than or equal to 7yo IM  Situational anxiety - Plan: ALPRAZolam (XANAX) 0.5 MG tablet  Encounter for initial prescription of vaginal ring hormonal contraceptive - discussed insertion and removal, possible adverse effects, reiterated safe sex practices     FEMALE PREVENTIVE CARE Updated 09/28/17   ANNUAL SCREENING/COUNSELING  Diet/Exercise - HEALTHY HABITS DISCUSSED TO DECREASE CV RISK Social History   Tobacco Use  Smoking Status Never Smoker  Smokeless Tobacco Never Used    Social History   Substance and Sexual Activity  Alcohol Use Yes  . Alcohol/week: 0.6 oz  . Types: 1 Standard drinks or equivalent per week    Depression screen [facility] 2/9 04/18/2016  Decreased Interest 1  Down, Depressed, Hopeless 1  PHQ - 2 Score 2  Altered sleeping 1  Tired, decreased energy 1  Change in appetite 1  Feeling bad or failure about yourself  1  Trouble concentrating 0  Moving slowly or fidgety/restless 0  Suicidal thoughts 1  PHQ-9 Score 7     Domestic violence concerns - no  HTN SCREENING - SEE [place]  Sexually active in the past year - Yes with female.  Need/want STI testing today? - no  Concerns about libido or pain with sex? - no  Plans for pregnancy? - none, atarting NuvaRing  INFECTIOUS DISEASE SCREENING  HIV - needs, decliend  GC/CT - needs, declined  HepC - DOB [birth year] -  does not need  TB - does not need  DISEASE SCREENING  Lipid - needs  DM2 - needs  Osteoporosis - women age 45+ - does not need  CANCER SCREENING  Cervical - does not need  Breast - does not need  Lung - does not need  Colon - does not need  ADULT VACCINATION  Influenza - annual vaccine recommended  Td - booster every 10 years   Zoster - Shingrix recommended 50+  PCV13 - was not indicated  PPSV23 - was not indicated Immunization History  Administered Date(s) Administered  . Influenza-Unspecified 01/23/2017  . Tdap 09/28/2017    OTHER  Fall - exercise and Vit D age 44+ - does not need    Patient Instructions  Plan:  Labs today for routine check-up   NuvaRing sent - let me know if there are any issues with this medicine  Alprazolam sent - let me know if mental health issues ever change or worsen      Visit summary with medication list and pertinent instructions  was printed for patient to review. All questions at time of visit were answered - patient instructed to contact office with any additional concerns. ER/RTC precautions were reviewed with the patient.   Follow-up plan: Return for Pap due in 01/2018, see me sooner if needed! .  OK to refill Alprazolam until follow-up as long as not increasing frequency of use - #30 for 90 days typical use is fine     Please note: voice recognition software was used to produce this document, and typos may escape review. Please contact Dr. Sheppard Coil for any needed clarifications.

## 2017-09-28 NOTE — Patient Instructions (Signed)
Plan:  Labs today for routine check-up   NuvaRing sent - let me know if there are any issues with this medicine  Alprazolam sent - let me know if mental health issues ever change or worsen

## 2017-09-29 LAB — LIPID PANEL
CHOL/HDL RATIO: 3.1 (calc) (ref <5.0)
CHOLESTEROL: 168 mg/dL (ref <200)
HDL: 54 mg/dL (ref >50)
LDL Cholesterol (Calc): 99 mg/dL (calc)
Non-HDL Cholesterol (Calc): 114 mg/dL (calc) (ref <130)
Triglycerides: 67 mg/dL (ref <150)

## 2017-09-29 LAB — COMPLETE METABOLIC PANEL WITHOUT GFR
AG Ratio: 1.8 (calc) (ref 1.0–2.5)
ALT: 12 U/L (ref 6–29)
AST: 14 U/L (ref 10–30)
Albumin: 4.3 g/dL (ref 3.6–5.1)
Alkaline phosphatase (APISO): 58 U/L (ref 33–115)
BUN: 14 mg/dL (ref 7–25)
CO2: 25 mmol/L (ref 20–32)
Calcium: 9.4 mg/dL (ref 8.6–10.2)
Chloride: 104 mmol/L (ref 98–110)
Creat: 0.81 mg/dL (ref 0.50–1.10)
GFR, Est African American: 117 mL/min/{1.73_m2}
GFR, Est Non African American: 101 mL/min/{1.73_m2}
Globulin: 2.4 g/dL (ref 1.9–3.7)
Glucose, Bld: 80 mg/dL (ref 65–99)
Potassium: 4.1 mmol/L (ref 3.5–5.3)
Sodium: 137 mmol/L (ref 135–146)
Total Bilirubin: 0.9 mg/dL (ref 0.2–1.2)
Total Protein: 6.7 g/dL (ref 6.1–8.1)

## 2017-09-29 LAB — CBC
HCT: 42.5 % (ref 35.0–45.0)
Hemoglobin: 14.7 g/dL (ref 11.7–15.5)
MCH: 30.2 pg (ref 27.0–33.0)
MCHC: 34.6 g/dL (ref 32.0–36.0)
MCV: 87.3 fL (ref 80.0–100.0)
MPV: 9.8 fL (ref 7.5–12.5)
PLATELETS: 354 10*3/uL (ref 140–400)
RBC: 4.87 10*6/uL (ref 3.80–5.10)
RDW: 12.3 % (ref 11.0–15.0)
WBC: 8.4 10*3/uL (ref 3.8–10.8)

## 2017-10-05 ENCOUNTER — Ambulatory Visit: Payer: 59 | Admitting: Osteopathic Medicine

## 2017-12-14 MED FILL — NUVARING VAGINAL RING: 0.12-0.015 | 84 days supply | Qty: 3 | Fill #1

## 2018-02-01 ENCOUNTER — Ambulatory Visit: Payer: 59 | Admitting: Osteopathic Medicine

## 2018-02-14 ENCOUNTER — Other Ambulatory Visit (HOSPITAL_COMMUNITY)
Admission: RE | Admit: 2018-02-14 | Discharge: 2018-02-14 | Disposition: A | Discharge status: Home / Self Care | Payer: 59 | Source: Ambulatory Visit | Attending: Family Medicine | Admitting: Family Medicine

## 2018-02-14 ENCOUNTER — Ambulatory Visit (INDEPENDENT_AMBULATORY_CARE_PROVIDER_SITE_OTHER): Payer: 59 | Admitting: Osteopathic Medicine

## 2018-02-14 ENCOUNTER — Encounter: Payer: Self-pay | Admitting: Osteopathic Medicine

## 2018-02-14 VITALS — BP 102/65 | HR 73 | Temp 98.1°F | Wt 128.4 lb

## 2018-02-14 DIAGNOSIS — Z124 Encounter for screening for malignant neoplasm of cervix: Secondary | ICD-10-CM | POA: Insufficient documentation

## 2018-02-14 DIAGNOSIS — N63 Unspecified lump in unspecified breast: Secondary | ICD-10-CM

## 2018-02-14 DIAGNOSIS — Z113 Encounter for screening for infections with a predominantly sexual mode of transmission: Secondary | ICD-10-CM | POA: Diagnosis not present

## 2018-02-14 NOTE — Progress Notes (Signed)
HPI: Jennifer Levy is a 27 y.o. female who  has a past medical history of Anorexia nervosa, Anxiety, Depression, and Migraine.  she presents to [facility] today, 02/14/18,  for chief complaint of:  Pap and breast exam only (annual physical already done 09/28/17)     Past medical history, surgical history, and family history reviewed.  Current medication list and allergy/intolerance information reviewed.   (See remainder of HPI, ROS, Phys Exam below)     ASSESSMENT/PLAN:   Cervical cancer screening - Plan: Cytology - PAP  Routine screening for STI (sexually transmitted infection) - Plan: C. trachomatis/N. gonorrhoeae RNA, WET PREP FOR TRICH, YEAST, CLUE  Lump, breast - Plan: US BREAST COMPLETE UNI RIGHT INC AXILLA     Follow-up plan: Return for annual physical when due in 09/2018, sooner if needed! .           ############################################ ############################################ ############################################ ############################################    Outpatient Encounter Medications as of 02/14/2018  Medication Sig  . ALPRAZolam (XANAX) 0.5 MG tablet Take 0.5-1 tablets (0.25-0.5 mg total) by mouth daily as needed for anxiety (sparing use). #30 tablets for 24-month supply  . etonogestrel-ethinyl estradiol (NUVARING) 0.12-0.015 MG/24HR vaginal ring Insert vaginally as directed and leave in place for 3 consecutive weeks, then remove for 1 week, then place new ring   No facility-administered encounter medications on file as of 02/14/2018.    No Known Allergies    Review of Systems:  Constitutional: No recent illness  Musculoskeletal: No new myalgia/arthralgia  Skin: No  Rash   GU: No unusual vaginal discharge or bleeding, no pelvic pain, no changes in urinary character/frequency  Hem/Onc: No  easy bruising/bleeding, No  abnormal lumps/bumps   Exam:  BP 102/65 (BP Location:  Left Arm, Patient Position: Sitting, Cuff Size: Normal)   Pulse 73   Temp 98.1 F (36.7 C) (Oral)   Wt 128 lb 6.4 oz (58.2 kg)   BMI 22.04 kg/m   Constitutional: VS see above. General Appearance: alert, well-developed, well-nourished, NAD  Eyes: Normal lids and conjunctive, non-icteric sclera  Ears, Nose, Mouth, Throat: MMM, Normal external inspection ears/nares/mouth/lips/gums.  Neck: No masses, trachea midline.   Respiratory: Normal respiratory effort.  Musculoskeletal: Gait normal. Symmetric and independent movement of all extremities  Neurological: Normal balance/coordination. No tremor.  Skin: warm, dry, intact.   Psychiatric: Normal judgment/insight. Normal mood and affect. Oriented x3.  GYN: No lesions/ulcers to external genitalia, normal urethra, normal vaginal mucosa, physiologic discharge, cervix normal without lesions, uterus not enlarged or tender, adnexa no masses and nontender  BREAST: No rashes/skin changes, normal fibrous breast tissue, small soft mass at 9:00 on R breast, no other masses or tenderness, normal nipple without discharge, normal axilla   Visit summary with medication list and pertinent instructions was printed for patient to review, advised to alert [place] if any changes needed. All questions at time of visit were answered - patient instructed to contact office with any additional concerns. ER/RTC precautions were reviewed with the patient and understanding verbalized.   Follow-up plan: Return for annual physical when due in 09/2018, sooner if needed! .   Please note: voice recognition software was used to produce this document, and typos may escape review. Please contact Dr. Sheppard Coil for any needed clarifications.

## 2018-02-15 LAB — C. TRACHOMATIS/N. GONORRHOEAE RNA
C. TRACHOMATIS RNA, TMA: NOT DETECTED
N. gonorrhoeae RNA, TMA: NOT DETECTED

## 2018-02-15 LAB — WET PREP FOR TRICH, YEAST, CLUE
MICRO NUMBER: 91219911
SPECIMEN QUALITY 3963: ADEQUATE

## 2018-02-18 LAB — CYTOLOGY - PAP: DIAGNOSIS: NEGATIVE

## 2018-03-07 ENCOUNTER — Inpatient Hospital Stay: Admission: RE | Admit: 2018-03-07 | Payer: 59 | Source: Ambulatory Visit

## 2018-05-13 ENCOUNTER — Other Ambulatory Visit: Payer: Self-pay | Admitting: Osteopathic Medicine

## 2018-05-13 DIAGNOSIS — F418 Other specified anxiety disorders: Secondary | ICD-10-CM

## 2018-05-13 MED FILL — ETONOGESTREL-ETHINYL ESTRAD: 0.12-0.015 | 84 days supply | Qty: 3 | Fill #2

## 2018-05-14 MED FILL — ALPRAZolam 0.5 MG TABS: 0.5 | 90 days supply | Qty: 30 | Fill #0

## 2018-05-14 NOTE — Telephone Encounter (Signed)
Sent!

## 2018-05-14 NOTE — Telephone Encounter (Signed)
[facility] pharmacy requesting med refill for alprazolam.

## 2018-05-14 NOTE — Telephone Encounter (Signed)
Pt has been updated of med refill sent to local pharmacy. No other inquiries during call.

## 2018-10-30 ENCOUNTER — Other Ambulatory Visit: Payer: Self-pay | Admitting: Osteopathic Medicine

## 2018-10-31 MED FILL — ETONOGESTREL-ETHINYL ESTRAD: 0.12-0.015 | 84 days supply | Qty: 3 | Fill #0

## 2018-11-22 ENCOUNTER — Encounter (INDEPENDENT_AMBULATORY_CARE_PROVIDER_SITE_OTHER): Payer: 59 | Admitting: Osteopathic Medicine

## 2018-11-22 DIAGNOSIS — F411 Generalized anxiety disorder: Secondary | ICD-10-CM

## 2018-11-22 DIAGNOSIS — F330 Major depressive disorder, recurrent, mild: Secondary | ICD-10-CM

## 2018-11-25 MED ORDER — BUPROPION HCL ER (XL) 150 MG PO TB24: 150.0000 mg | ORAL_TABLET | Freq: Every day | ORAL | 3 refills | Status: DC

## 2018-11-25 MED FILL — buPROPion HCL ER (XL) 150 M: 150 | 90 days supply | Qty: 90 | Fill #0

## 2018-11-25 NOTE — Telephone Encounter (Signed)
5 mins spent  

## 2018-12-02 ENCOUNTER — Other Ambulatory Visit: Payer: Self-pay

## 2018-12-02 DIAGNOSIS — R6889 Other general symptoms and signs: Secondary | ICD-10-CM | POA: Diagnosis not present

## 2018-12-02 DIAGNOSIS — Z20822 Contact with and (suspected) exposure to covid-19: Secondary | ICD-10-CM

## 2018-12-04 LAB — NOVEL CORONAVIRUS, NAA: SARS-CoV-2, NAA: NOT DETECTED

## 2019-01-31 ENCOUNTER — Other Ambulatory Visit: Payer: Self-pay | Admitting: Osteopathic Medicine

## 2019-01-31 DIAGNOSIS — F418 Other specified anxiety disorders: Secondary | ICD-10-CM

## 2019-01-31 MED FILL — ALPRAZolam 0.5 MG TABS: 0.5 | 90 days supply | Qty: 30 | Fill #0

## 2019-01-31 MED FILL — ETONOGESTREL-ETHINYL ESTRAD: 0.12-0.015 | 84 days supply | Qty: 3 | Fill #1

## 2019-01-31 NOTE — Telephone Encounter (Signed)
[facility] outpatient pharmacy requesting med refill for alprazolam.

## 2019-02-11 ENCOUNTER — Ambulatory Visit (INDEPENDENT_AMBULATORY_CARE_PROVIDER_SITE_OTHER): Payer: 59 | Admitting: Osteopathic Medicine

## 2019-02-11 ENCOUNTER — Other Ambulatory Visit: Payer: Self-pay

## 2019-02-11 ENCOUNTER — Encounter: Payer: Self-pay | Admitting: Osteopathic Medicine

## 2019-02-11 VITALS — BP 121/82 | HR 97 | Temp 98.7°F | Wt 153.5 lb

## 2019-02-11 DIAGNOSIS — G44209 Tension-type headache, unspecified, not intractable: Secondary | ICD-10-CM | POA: Diagnosis not present

## 2019-02-11 DIAGNOSIS — N62 Hypertrophy of breast: Secondary | ICD-10-CM | POA: Diagnosis not present

## 2019-02-11 DIAGNOSIS — Z23 Encounter for immunization: Secondary | ICD-10-CM | POA: Diagnosis not present

## 2019-02-11 DIAGNOSIS — M549 Dorsalgia, unspecified: Secondary | ICD-10-CM

## 2019-02-11 DIAGNOSIS — M542 Cervicalgia: Secondary | ICD-10-CM | POA: Diagnosis not present

## 2019-02-11 NOTE — Progress Notes (Signed)
HPI: Jennifer Levy is a 28 y.o. female who  has a past medical history of Anorexia nervosa, Anxiety, Depression, and Migraine.  she presents to [facility] today, 02/11/19,  for chief complaint of: Back pain  . Location: upper back between shoulder blades, and neck . Quality: tightness, soreness . Severity/Duration: worsening over past few months . Timing: worse w/ long periods of standing/walking at work . Modifying factors: ibuprofen helps some  . Assoc signs/symptoms: occasional tension headaches  . Context: No injury. She would like to discuss breast reduction surgery to alleviate symptoms.      Past medical, surgical, social and family history reviewed:  Patient Active Problem List   Diagnosis Date Noted  . Generalized anxiety disorder 03/13/2016  . Mild episode of recurrent major depressive disorder ([place]) 03/13/2016  . Intermittent palpitations 03/13/2016    Past Surgical History:  Procedure Laterality Date  . TMJ ARTHROSCOPY  2012  . WISDOM TOOTH EXTRACTION  2011    Social History   Tobacco Use  . Smoking status: Never Smoker  . Smokeless tobacco: Never Used  Substance Use Topics  . Alcohol use: Yes    Alcohol/week: 1.0 standard drinks    Types: 1 Standard drinks or equivalent per week    Family History  Problem Relation Age of Onset  . Depression Mother   . Alcohol abuse Brother   . Stroke Maternal Aunt   . Alcohol abuse Maternal Uncle   . Diabetes Maternal Grandfather   . Cancer Maternal Grandmother        breast      Current medication list and allergy/intolerance information reviewed:    Current Outpatient Medications  Medication Sig Dispense Refill  . ALPRAZolam (XANAX) 0.5 MG tablet TAKE 1/2 TO 1 TABLET BY MOUTH DAILY AS NEEDED FOR ANXIETY. USE SPARINGLY. 30 TABLETS TO LAST 90 DAYS 30 tablet 0  . buPROPion (WELLBUTRIN XL) 150 MG 24 hr tablet Take 1 tablet (150 mg total) by mouth daily. 90 tablet 3  .  etonogestrel-ethinyl estradiol (NUVARING) 0.12-0.015 MG/24HR vaginal ring INSERT VAGINALLY AS DIRECTED AND LEAVE IN PLACE FOR 3 CONSECUTIVE WEEKS, THEN REMOVE FOR 1 WEEK, THEN PLACE NEW RING 3 each 3   No current facility-administered medications for this visit.     No Known Allergies    Review of Systems:  Constitutional:  No  fever, no chills, No recent illness  HEENT: +headache per HPI, no vision change, no hearing change, No sore throat, No  sinus pressure  Cardiac: No  chest pain, No  pressure  Respiratory:  No  shortness of breath. No  Cough   Musculoskeletal: No new myalgia/arthralgia, +chronic back/neck pain per HPI  Neurologic: No  weakness, No  dizziness  Psychiatric: No  concerns with depression, No  concerns with anxiety  Exam:  BP 121/82 (BP Location: Left Arm, Patient Position: Sitting, Cuff Size: Normal)   Pulse 97   Temp 98.7 F (37.1 C) (Oral)   Wt 153 lb 8 oz (69.6 kg)   BMI 26.35 kg/m   Constitutional: VS see above. General Appearance: alert, well-developed, well-nourished, NAD  Eyes: Normal lids and conjunctive, non-icteric sclera  Neck: No masses, trachea midline. No thyroid enlargement. No tenderness/mass appreciated. No lymphadenopathy  Respiratory: Normal respiratory effort. no wheeze, no rhonchi, no rales  Cardiovascular: S1/S2 normal, no murmur, no rub/gallop auscultated. RRR. No lower extremity edema.   Musculoskeletal: Gait normal. No clubbing/cyanosis of digits. No midline spinal tenderness. (+)muscle spasm rhomboids R  Neurological: Normal balance/coordination. No tremor.    Skin: warm, dry, intact. No rash/ulcer.  Psychiatric: Normal judgment/insight. Normal mood and affect. Oriented x3.    No results found for this or any previous visit (from the past 72 hour(s)).  No results found.   ASSESSMENT/PLAN: The primary encounter diagnosis was Upper back pain. Diagnoses of Need for influenza vaccination, Neck pain, Tension headache,  and Large breasts were also pertinent to this visit.   Orders Placed This Encounter  Procedures  . Flu Vaccine QUAD 6+ mos PF IM (Fluarix Quad PF)  . Ambulatory referral to Plastic Surgery         Visit summary with medication list and pertinent instructions was printed for patient to review. All questions at time of visit were answered - patient instructed to contact office with any additional concerns or updates. ER/RTC precautions were reviewed with the patient.   Note: Total time spent 25 minutes, greater than 50% of the visit was spent face-to-face counseling and coordinating care for the above diagnoses listed in assessment/plan.   Please note: voice recognition software was used to produce this document, and typos may escape review. Please contact Dr. Sheppard Coil for any needed clarifications.     Follow-up plan: Return if symptoms worsen or fail to improve.

## 2019-02-28 ENCOUNTER — Encounter: Payer: Self-pay | Admitting: Osteopathic Medicine

## 2019-03-03 ENCOUNTER — Ambulatory Visit (INDEPENDENT_AMBULATORY_CARE_PROVIDER_SITE_OTHER): Payer: 59 | Admitting: Osteopathic Medicine

## 2019-03-03 ENCOUNTER — Encounter: Payer: Self-pay | Admitting: Osteopathic Medicine

## 2019-03-03 VITALS — Wt 145.0 lb

## 2019-03-03 DIAGNOSIS — F331 Major depressive disorder, recurrent, moderate: Secondary | ICD-10-CM

## 2019-03-03 DIAGNOSIS — F329 Major depressive disorder, single episode, unspecified: Secondary | ICD-10-CM

## 2019-03-03 DIAGNOSIS — F32A Depression, unspecified: Secondary | ICD-10-CM

## 2019-03-03 DIAGNOSIS — F411 Generalized anxiety disorder: Secondary | ICD-10-CM | POA: Diagnosis not present

## 2019-03-03 MED ORDER — VRAYLAR 1.5 & 3 MG PO CPPK: ORAL_CAPSULE | ORAL | 0 refills | Status: AC

## 2019-03-03 MED ORDER — CARIPRAZINE HCL 3 MG PO CAPS: 3.0000 mg | ORAL_CAPSULE | Freq: Every day | ORAL | 1 refills | Status: DC

## 2019-03-03 NOTE — Progress Notes (Signed)
Virtual Visit via Video (App used: Doximity) Note  I connected with      Jennifer Levy on 03/03/19 at 4:26 PM  by a telemedicine application and verified that I am speaking with the correct person using two identifiers.  Patient is at home I am in office    I discussed the limitations of evaluation and management by telemedicine and the availability of in person appointments. The patient expressed understanding and agreed to proceed.  History of Present Illness: Jennifer Levy is a 28 y.o. female who would like to discuss anxiety/depression    Anxiety/Depression: Seems to be getting worse over the past couple of months.  Increased pressure at work. 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  She is having a lot of frustrations at work, she has been working pretty much every weekend for several months now, her bosses have not accommodated her request for at least a couple weekends off per month.  Current medications: Wellbutrin 150 mg daily  Sparing use Xanax #30 x 90 days  Previous medications:  Citalopram Seroquel  Lexapro  Others, not sure names     Mood disorder questionnaire was borderline  Depression screen [facility] 2/9 03/03/2019 02/14/2018 04/18/2016  Decreased Interest 2 1 1   Down, Depressed, Hopeless 1 1 1   PHQ - 2 Score 3 2 2   Altered sleeping 3 1 1   Tired, decreased energy 3 2 1   Change in appetite 0 0 1  Feeling bad or failure about yourself  3 0 1  Trouble concentrating 1 0 0  Moving slowly or fidgety/restless 1 0 0  Suicidal thoughts 1 0 1  PHQ-9 Score 15 5 7   Difficult doing work/chores Very difficult Somewhat difficult -   GAD 7 : Generalized Anxiety Score 03/03/2019 02/14/2018 04/18/2016 03/13/2016  Nervous, Anxious, on Edge 3 2 2 2   Control/stop worrying 3 1 2 1   Worry too much - different things 3 1 1 1   Trouble relaxing 3 1 1 2   Restless 1 0 1 1  Easily annoyed or irritable 1 1 0 1  Afraid - awful might happen 1 0 1 2  Total  GAD 7 Score 15 6 8 10   Anxiety Difficulty Very difficult Somewhat difficult Somewhat difficult -        Observations/Objective: Wt 145 lb (65.8 kg)   BMI 24.89 kg/m  BP Readings from Last 3 Encounters:  02/11/19 121/82  02/14/18 102/65  09/28/17 115/65   Exam: Normal Speech.  NAD  Lab and Radiology Results No results found for this or any previous visit (from the past 72 hour(s)). No results found.     Assessment and Plan: 28 y.o. female with The primary encounter diagnosis was Mood disorder of depressed type. Diagnoses of Generalized anxiety disorder and Moderate episode of recurrent major depressive disorder (HCC) were also pertinent to this visit.   PDMP not reviewed this encounter. Orders Placed This Encounter  Procedures  . Ambulatory referral to Psychiatry    Referral Priority:   Routine    Referral Type:   Psychiatric    Referral Reason:   Specialty Services Required    Requested Specialty:   Psychiatry    Number of Visits Requested:   1   Meds ordered this encounter  Medications  . Cariprazine HCl (VRAYLAR) 1.5 & 3 MG CPPK    Sig: Take 1.5 mg by mouth daily for 1 day, THEN 3 mg daily for 6 days.  Dispense:  7 each    Refill:  0  . cariprazine (VRAYLAR) capsule    Sig: Take 1 capsule (3 mg total) by mouth daily.    Dispense:  30 capsule    Refill:  1   Patient Instructions  For immediate mental health services:  [place], [address] [place], [place] [address], [facility], [address], [place], [place] [address], [phone]  Any emergency room  National Suicide Prevention Lifeline, 9027141223  Savings information for the Vraylar Rx  [email or web address]   Instructions sent via [place]. If MyChart not available, pt was given option for info via personal e-mail w/ no guarantee of protected health info over unsecured e-mail communication, and MyChart  sign-up instructions were included.   Follow Up Instructions: Return in about 2 weeks (around 03/17/2019) for RECHECK ON NEW MEDICATION FOR DEPRESSION/ANXIETY .    I discussed the assessment and treatment plan with the patient. The patient was provided an opportunity to ask questions and all were answered. The patient agreed with the plan and demonstrated an understanding of the instructions.   The patient was advised to call back or seek an in-person evaluation if any new concerns, if symptoms worsen or if the condition fails to improve as anticipated.  25 minutes of non-face-to-face time was provided during this encounter.                      Historical information moved to improve visibility of documentation.  Past Medical History:  Diagnosis Date  . Anorexia nervosa   . Anxiety   . Depression   . Migraine    Past Surgical History:  Procedure Laterality Date  . TMJ ARTHROSCOPY  2012  . WISDOM TOOTH EXTRACTION  2011   Social History   Tobacco Use  . Smoking status: Never Smoker  . Smokeless tobacco: Never Used  Substance Use Topics  . Alcohol use: Yes    Alcohol/week: 1.0 standard drinks    Types: 1 Standard drinks or equivalent per week   family history includes Alcohol abuse in her brother and maternal uncle; Cancer in her maternal grandmother; Depression in her mother; Diabetes in her maternal grandfather; Stroke in her maternal aunt.  Medications: Current Outpatient Medications  Medication Sig Dispense Refill  . ALPRAZolam (XANAX) 0.5 MG tablet TAKE 1/2 TO 1 TABLET BY MOUTH DAILY AS NEEDED FOR ANXIETY. USE SPARINGLY. 30 TABLETS TO LAST 90 DAYS 30 tablet 0  . buPROPion (WELLBUTRIN XL) 150 MG 24 hr tablet Take 1 tablet (150 mg total) by mouth daily. 90 tablet 3  . cariprazine (VRAYLAR) capsule Take 1 capsule (3 mg total) by mouth daily. 30 capsule 1  . Cariprazine HCl (VRAYLAR) 1.5 & 3 MG CPPK Take 1.5 mg by mouth daily for 1 day, THEN 3 mg daily for  6 days. 7 each 0  . etonogestrel-ethinyl estradiol (NUVARING) 0.12-0.015 MG/24HR vaginal ring INSERT VAGINALLY AS DIRECTED AND LEAVE IN PLACE FOR 3 CONSECUTIVE WEEKS, THEN REMOVE FOR 1 WEEK, THEN PLACE NEW RING 3 each 3   No current facility-administered medications for this visit.    No Known Allergies  PDMP not reviewed this encounter. Orders Placed This Encounter  Procedures  . Ambulatory referral to Psychiatry    Referral Priority:   Routine    Referral Type:   Psychiatric    Referral Reason:   Specialty Services Required    Requested Specialty:   Psychiatry  Number of Visits Requested:   1   Meds ordered this encounter  Medications  . Cariprazine HCl (VRAYLAR) 1.5 & 3 MG CPPK    Sig: Take 1.5 mg by mouth daily for 1 day, THEN 3 mg daily for 6 days.    Dispense:  7 each    Refill:  0  . cariprazine (VRAYLAR) capsule    Sig: Take 1 capsule (3 mg total) by mouth daily.    Dispense:  30 capsule    Refill:  1

## 2019-03-03 NOTE — Progress Notes (Signed)
Attempted to contact pt at 1233 pm and 1255 pm, no answer. Left multiple vm msgs.

## 2019-03-03 NOTE — Patient Instructions (Addendum)
For immediate mental health services:  [facility], [address], [place], [place] [address], [facility], [address] [place], [place] [address], [phone]  Any emergency room  National Suicide Prevention Lifeline, ([phone] 871-8101  Savings information for the Vraylar Rx  [email or web address]

## 2019-03-04 ENCOUNTER — Encounter: Payer: Self-pay | Admitting: Osteopathic Medicine

## 2019-03-04 MED FILL — buPROPion HCL ER (XL) 150 M: 150 | 90 days supply | Qty: 90 | Fill #1

## 2019-03-05 ENCOUNTER — Telehealth: Payer: Self-pay | Admitting: Osteopathic Medicine

## 2019-03-05 NOTE — Telephone Encounter (Signed)
Received Fax for prior authorization on Vraylar sent through cover my meds waiting on determination. - CF

## 2019-03-06 ENCOUNTER — Encounter: Payer: Self-pay | Admitting: Osteopathic Medicine

## 2019-03-07 NOTE — Telephone Encounter (Signed)
Vraylar sent Monday did we get PA for this?

## 2019-03-10 NOTE — Telephone Encounter (Signed)
Received fax from [place] and drug was denied due to step therapy must be followed and two generic atypical antipsychotics medications have to be tried first. I am placing in providers box for further review. - CF  Reference: [id number]

## 2019-03-12 ENCOUNTER — Other Ambulatory Visit: Payer: Self-pay

## 2019-03-12 ENCOUNTER — Ambulatory Visit (HOSPITAL_COMMUNITY): Payer: 59 | Admitting: Psychiatry

## 2019-03-14 NOTE — Telephone Encounter (Signed)
I got notification that Jennifer Levy was denied to step therapy issue, insurance is staying we have to try and fail to generic antipsychotic medications, however I am not prescribing this for mood disorder but rather for major depressive disorder, does that make a difference/did they pay attention to this information?  Also may be able to get it through drug company with savings card?  Has the patient tried this?  Once this issue is clarified, let me know.  If the drug is still denied/if savings card is not helpful, we can try something else for the patient but let's double check on this first.   Thank you for your help on this!

## 2019-03-19 MED FILL — buPROPion HCL ER (XL) 150 M: 150 | 90 days supply | Qty: 90 | Fill #1

## 2019-03-20 ENCOUNTER — Other Ambulatory Visit: Payer: Self-pay | Admitting: Osteopathic Medicine

## 2019-03-20 MED ORDER — ARIPIPRAZOLE 5 MG PO TABS: ORAL_TABLET | ORAL | 0 refills | Status: DC

## 2019-03-20 NOTE — Progress Notes (Signed)
abilify started

## 2019-03-21 ENCOUNTER — Telehealth: Payer: Self-pay

## 2019-03-21 MED ORDER — ARIPIPRAZOLE 10 MG PO TABS: 10.0000 mg | ORAL_TABLET | Freq: Every day | ORAL | 1 refills | Status: DC

## 2019-03-21 MED FILL — ARIPIPRAZOLE 5 MG TABS: 5 | 7 days supply | Qty: 7 | Fill #0

## 2019-03-21 MED FILL — ARIPIPRAZOLE 10 MG TABS: 10 | 30 days supply | Qty: 30 | Fill #0

## 2019-03-21 NOTE — Telephone Encounter (Signed)
As per [place] will only pay for 1 tab daily. Can pharmacy fill #7 of the 5 mg and provider send in a separate rx for 10 mg daily. If appropriate, pls send updated rxs to pharmacy".

## 2019-03-21 NOTE — Telephone Encounter (Signed)
Pharmacist advised

## 2019-03-21 NOTE — Telephone Encounter (Signed)
Okay to dispense 7 of the 5 mg tablets and I sent a separate prescription for the 10 mg

## 2019-03-25 ENCOUNTER — Ambulatory Visit (INDEPENDENT_AMBULATORY_CARE_PROVIDER_SITE_OTHER): Payer: 59 | Admitting: Plastic Surgery

## 2019-03-25 ENCOUNTER — Other Ambulatory Visit: Payer: Self-pay

## 2019-03-25 ENCOUNTER — Encounter: Payer: Self-pay | Admitting: Plastic Surgery

## 2019-03-25 DIAGNOSIS — M546 Pain in thoracic spine: Secondary | ICD-10-CM | POA: Diagnosis not present

## 2019-03-25 DIAGNOSIS — N62 Hypertrophy of breast: Secondary | ICD-10-CM | POA: Diagnosis not present

## 2019-03-25 DIAGNOSIS — M542 Cervicalgia: Secondary | ICD-10-CM | POA: Diagnosis not present

## 2019-03-25 DIAGNOSIS — G8929 Other chronic pain: Secondary | ICD-10-CM

## 2019-03-25 DIAGNOSIS — M549 Dorsalgia, unspecified: Secondary | ICD-10-CM | POA: Insufficient documentation

## 2019-03-25 NOTE — Progress Notes (Signed)
Patient ID: Jennifer Levy, female    DOB: [birth year], 28 y.o.   MRN: [id number]   Chief Complaint  Patient presents with  . Breast Problem    Mammary Hyperplasia: The patient is a 28 y.o. female with a history of mammary hyperplasia for several years.  She has extremely large breasts causing symptoms that include the following: Back pain in the upper and lower back, including neck pain. She pulls or pins her bra straps to provide better lift and relief of the pressure and pain. She notices relief by holding her breast up manually.  Her shoulder straps cause grooves and pain and pressure that requires padding for relief. Pain medication is sometimes required with motrin and tylenol.  Activities that are hindered by enlarged breasts include: exercise and running. She has been to massage therapy and her PCP without resolution of the pain  Her breasts are extremely large and fairly symmetric the left is slightly larger.  She has hyperpigmentation of the inframammary area on both sides.  The sternal to nipple distance on the right is 28 cm and the left is 29 cm.  The IMF distance is 13 cm.  She is 5 feet 4 inches tall and weighs 150 pounds.  Preoperative bra size = 34 G cup.  She would like to be a D cup.  Her circumference is 32-33 inches  The estimated excess breast tissue to be removed at the time of surgery = 400 grams on the left and 400 grams on the right.  Mammogram history: Never.  She thinks that somebody on her mom side of the family was positive for BRCA but is not sure.   Review of Systems  Constitutional: Positive for activity change. Negative for appetite change.  HENT: Negative.   Eyes: Negative.   Respiratory: Negative.  Negative for chest tightness and shortness of breath.   Cardiovascular: Negative.  Negative for leg swelling.  Gastrointestinal: Negative.   Endocrine: Negative.   Genitourinary: Negative.   Musculoskeletal: Positive for back pain and neck pain.  Skin:  Negative for color change and wound.  Neurological: Negative.   Hematological: Negative.   Psychiatric/Behavioral: Negative.     Past Medical History:  Diagnosis Date  . Anorexia nervosa   . Anxiety   . Depression   . Migraine     Past Surgical History:  Procedure Laterality Date  . TMJ ARTHROSCOPY  2012  . WISDOM TOOTH EXTRACTION  2011      Current Outpatient Medications:  .  ALPRAZolam (XANAX) 0.5 MG tablet, TAKE 1/2 TO 1 TABLET BY MOUTH DAILY AS NEEDED FOR ANXIETY. USE SPARINGLY. 30 TABLETS TO LAST 90 DAYS, Disp: 30 tablet, Rfl: 0 .  ARIPiprazole (ABILIFY) 10 MG tablet, Take 1 tablet (10 mg total) by mouth daily., Disp: 30 tablet, Rfl: 1 .  buPROPion (WELLBUTRIN XL) 150 MG 24 hr tablet, Take 1 tablet (150 mg total) by mouth daily., Disp: 90 tablet, Rfl: 3 .  cariprazine (VRAYLAR) capsule, Take 1 capsule (3 mg total) by mouth daily., Disp: 30 capsule, Rfl: 1 .  etonogestrel-ethinyl estradiol (NUVARING) 0.12-0.015 MG/24HR vaginal ring, INSERT VAGINALLY AS DIRECTED AND LEAVE IN PLACE FOR 3 CONSECUTIVE WEEKS, THEN REMOVE FOR 1 WEEK, THEN PLACE NEW RING, Disp: 3 each, Rfl: 3   Objective:   There were no vitals filed for this visit.  Physical Exam Vitals signs and nursing note reviewed.  Constitutional:      Appearance: Normal appearance.  HENT:  Head: Normocephalic and atraumatic.  Eyes:     Extraocular Movements: Extraocular movements intact.  Neck:     Musculoskeletal: Normal range of motion.  Cardiovascular:     Rate and Rhythm: Normal rate.     Pulses: Normal pulses.  Pulmonary:     Effort: Pulmonary effort is normal. No respiratory distress.     Breath sounds: No wheezing.  Abdominal:     General: Abdomen is flat. There is no distension.     Tenderness: There is no abdominal tenderness.  Skin:    General: Skin is warm.  Neurological:     General: No focal deficit present.     Mental Status: She is alert and oriented to person, place, and time.   Psychiatric:        Mood and Affect: Mood normal.        Behavior: Behavior normal.        Thought Content: Thought content normal.     Assessment & Plan:  Symptomatic mammary hypertrophy  Neck pain  Chronic bilateral thoracic back pain  Recommend bilateral breast reduction. Recommend physical therapy. We discussed some of the risks and complications including loss of nipple areola, change in sensation and possible inability to breast-feed in the future. Pictures were obtained of the patient and placed in the chart with the patient's or guardian's permission.   [place], DO

## 2019-03-27 ENCOUNTER — Encounter (HOSPITAL_COMMUNITY): Payer: Self-pay | Admitting: Psychiatry

## 2019-03-27 ENCOUNTER — Ambulatory Visit (INDEPENDENT_AMBULATORY_CARE_PROVIDER_SITE_OTHER): Payer: 59 | Admitting: Psychiatry

## 2019-03-27 VITALS — Ht 64.0 in | Wt 150.0 lb

## 2019-03-27 DIAGNOSIS — F0630 Mood disorder due to known physiological condition, unspecified: Secondary | ICD-10-CM | POA: Diagnosis not present

## 2019-03-27 DIAGNOSIS — F331 Major depressive disorder, recurrent, moderate: Secondary | ICD-10-CM | POA: Diagnosis not present

## 2019-03-27 DIAGNOSIS — F411 Generalized anxiety disorder: Secondary | ICD-10-CM | POA: Diagnosis not present

## 2019-03-27 MED ORDER — BUSPIRONE HCL 7.5 MG PO TABS: 7.5000 mg | ORAL_TABLET | Freq: Every day | ORAL | 0 refills | Status: DC

## 2019-03-27 MED FILL — BUSPIRONE HCL 7.5 MG TABS: 7.5 | 30 days supply | Qty: 30 | Fill #0
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
## 2019-03-28 ENCOUNTER — Telehealth: Payer: Self-pay | Admitting: Osteopathic Medicine

## 2019-03-28 DIAGNOSIS — F411 Generalized anxiety disorder: Secondary | ICD-10-CM

## 2019-03-28 NOTE — Telephone Encounter (Signed)
Please call patient: I am filling out her FMLA forms, I assume for her mental health situation.  I need to clarify with her she is asking for continuous leave, reduced schedule, or intermittent leave?  Is she asking for continuous leave, such as an extended period of time off?    If so, I need to start date and end date of leave  Is she asking for a reduced schedule, such as working fewer hours?   If so, can she give me an estimate of how many days/hours per week she can tolerate  Is she asking for intermittent leave, this would cover periods of time off due to flare-ups?

## 2019-03-28 NOTE — Telephone Encounter (Signed)
At provider's request contacted pt regarding FMLA form. As per pt, FMLA paperwork is for her mental health situation and it will be for intermittent leave.

## 2019-03-31 NOTE — Telephone Encounter (Signed)
Thanks, I'll have spadework completed and sent out by end of today!

## 2019-03-31 NOTE — Telephone Encounter (Signed)
paperwork

## 2019-04-11 ENCOUNTER — Encounter: Payer: Self-pay | Admitting: Osteopathic Medicine

## 2019-04-14 ENCOUNTER — Other Ambulatory Visit: Payer: Self-pay

## 2019-04-14 ENCOUNTER — Encounter: Payer: Self-pay | Admitting: Physical Therapy

## 2019-04-14 ENCOUNTER — Ambulatory Visit: Payer: 59 | Attending: Plastic Surgery | Admitting: Physical Therapy

## 2019-04-14 DIAGNOSIS — M542 Cervicalgia: Secondary | ICD-10-CM | POA: Diagnosis not present

## 2019-04-14 DIAGNOSIS — M546 Pain in thoracic spine: Secondary | ICD-10-CM | POA: Diagnosis not present

## 2019-04-14 DIAGNOSIS — M6281 Muscle weakness (generalized): Secondary | ICD-10-CM | POA: Diagnosis not present

## 2019-04-14 NOTE — Patient Instructions (Signed)
Access Code: [email or web address]  URL: [email or web address]  Date: 04/14/2019  Prepared by: Hilda Blades   Exercises Sidelying Thoracic Lumbar Rotation - 10 reps - 1-2x daily Kneeling Thoracic Extension Stretch with Swiss Ball - 10 reps - 1-2x daily Thoracic Extension Mobilization on Foam Roll - 10 reps - 1-2x daily Prone Chest Stretch on Chair - 10 reps - 1-2x daily Standing Bilateral Low Shoulder Row with Anchored Resistance - 10-15 reps - 2 sets - 1x daily Standing Shoulder Horizontal Abduction with Resistance - 10-15 reps - 2 sets - 1x daily Shoulder External Rotation and Scapular Retraction with Resistance - 10-15 reps - 2 sets - 1x daily

## 2019-04-15 ENCOUNTER — Encounter: Payer: Self-pay | Admitting: Plastic Surgery

## 2019-04-15 NOTE — Therapy (Addendum)
[place], [place], [address] Phone: [phone]   Fax:  [phone]  Physical Therapy Evaluation  Patient Details  Name: Jennifer Levy MRN: [id number] Date of Birth: May 31, 1990 Referring Provider (PT): Wallace Going, DO   Encounter Date: 04/14/2019  PT End of Session - 04/14/19 1655    Visit Number  1    Number of Visits  6    Date for PT Re-Evaluation  06/09/19    Authorization Type  [facility] [employer]    PT Start Time  1651    PT Stop Time  1730    PT Time Calculation (min)  39 min    Activity Tolerance  Patient tolerated treatment well    Behavior During Therapy  [facility] for tasks assessed/performed       Past Medical History:  Diagnosis Date  . Anorexia nervosa   . Anxiety   . Depression   . Migraine     Past Surgical History:  Procedure Laterality Date  . TMJ ARTHROSCOPY  2012  . WISDOM TOOTH EXTRACTION  2011    There were no vitals filed for this visit.   Subjective Assessment - 04/14/19 1658    Subjective  Patient reports upper back and neck pain that can result in headaches. She reports she gets really uncomfortable at work especially after a few days of work in a row. She experiences a burning feeling in the upper back. Headaches occur about 4x/week and start around upper back and neck area. She is hoping to get a breast reduction surgery.    Pertinent History  Depression, anxiety    Limitations  Sitting;Standing;Lifting;Other (comment);House hold activities   Exercise such as running   How long can you sit comfortably?  Patient reports general discomfort, worse with more time    How long can you stand comfortably?  Patient reports general discomfort, worse with more time    How long can you walk comfortably?  Patient reports general discomfort, worse with more time    Diagnostic tests  None    Patient Stated Goals  Patient would like to reduce pain in upper back at work and be able to  exercise    Currently in Pain?  Yes    Pain Score  2     Pain Location  Back    Pain Orientation  Mid;Upper    Pain Descriptors / Indicators  Aching;Burning    Pain Type  Chronic pain    Pain Radiating Towards  occasionall results in headache    Pain Onset  More than a month ago    Pain Frequency  Constant    Aggravating Factors   Being on feet a lot, activity    Pain Relieving Factors  Ibuprofen    Effect of Pain on Daily Activities  Patient has pain at work and with activities such as running or high impact.         [facility] PT Assessment - 04/15/19 0001      Assessment   Medical Diagnosis  Upper back pain, neck pain, headaches; Symptomatic mammary hypertrophy    Referring Provider (PT)  Dillingham, Loel Lofty, DO    Onset Date/Surgical Date  --   patient reports pain for years   Hand Dominance  Right    Next MD Visit  Not scheduled    Prior Therapy  None      Precautions   Precautions  None      Restrictions  Weight Bearing Restrictions  No      Balance Screen   Has the patient fallen in the past 6 months  No    Has the patient had a decrease in activity level because of a fear of falling?   No    Is the patient reluctant to leave their home because of a fear of falling?   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    Leisure  Patient used to enjoy exercise, walking dogs      Cognition   Overall Cognitive Status  Within Functional Limits for tasks assessed      Observation/Other Assessments   Focus on Therapeutic Outcomes (FOTO)   Not assessed due to patient not being added in system      Sensation   Light Touch  Appears Intact      Posture/Postural Control   Posture Comments  Patient exhibits rounded shoulder posture      ROM / Strength   AROM / PROM / Strength  AROM;Strength      AROM   Overall  AROM Comments  Thoracic rotation and extension limited, all other [facility]      Strength   Overall Strength Comments  Periscapular strength grossly 4-/5 bilaterally, all other [facility]      Flexibility   Soft Tissue Assessment /Muscle Length  yes   bilat upper trap and levator tightness     Palpation   Spinal mobility  WFL    Palpation comment  Patient reports increased muscular tension cervical and thoracic paraspinals, suboccipital region      Special Tests   Other special tests  All cervical testing negative      Transfers   Transfers  Independent with all Transfers                Objective measurements completed on examination: See above findings.      [facility] Adult PT Treatment/Exercise - 04/15/19 0001      Exercises   Exercises  Neck      Neck Exercises: Theraband   Rows  10 reps;Red    Shoulder External Rotation  10 reps;Red    Horizontal ADduction  10 reps;Red    Horizontal ADduction Limitations  palm up      Neck Exercises: Supine   Other Supine Exercise  Thoracic extension over foam roller      Neck Exercises: Stretches   Other Neck Stretches  Kneeling thoracic extension stretch using ball or chair    Other Neck Stretches  Side lying thoracic rotation open book stretch             PT Education - 04/14/19 1654    Education Details  Exam findings, POC, HEP    Person(s) Educated  Patient    Methods  Explanation;Demonstration;Verbal cues;Handout    Comprehension  Verbalized understanding;Returned demonstration;Verbal cues required;Need further instruction       PT Short Term Goals - 04/14/19 1754      PT SHORT TERM GOAL #1   Title  Patient will be independent with intial HEP in order to maintain progress with PT.    Baseline  HEP given at eval    Time  4    Period  Weeks    Status  New  Target Date  05/12/19        PT Long Term Goals - 04/14/19 1755      PT LONG TERM GOAL #1   Title  Patient will exhibit periscapular strength grossly  4+/5 MMT in order to maintain better posture and reduce pain at work.    Baseline  Grossly 4-/5 MMT    Time  6    Period  Weeks    Status  New    Target Date  05/26/19      PT LONG TERM GOAL #2   Title  Patient will report < or = 0-1/10 upper back pain level with work to reduce discomfort.    Time  6    Period  Weeks    Status  New    Target Date  05/26/19      PT LONG TERM GOAL #3   Title  Patient will report < or = 1-2 headaches per week to allow for improve activity level.    Time  6    Period  Weeks    Status  New    Target Date  05/26/19             Plan - 04/14/19 1655    Clinical Impression Statement  Patient presents to physical therapy with mid and upper back pain, cervical pain, and headaches that are most likely cervicogenic in nature. She is planning to have breast reduction surgery in order to reduce her pain. She does exhibit rounded shoulder posture with impaired periscapular strength and endurance. She was provided with exercises to improve thoracic mobility and postural strength/control so she would have less pain at work and with activity. She would benefit from continued skilled PT to progress her postural control and reduce pain.    Personal Factors and Comorbidities  Past/Current Experience    Examination-Activity Limitations  Sit;Lift;Bend;Locomotion Level;Caring for Others;Carry;Stand;Sleep    Examination-Participation Restrictions  Yard Work;Cleaning;Laundry;Community Activity    Stability/Clinical Decision Making  Stable/Uncomplicated    Clinical Decision Making  Low    Rehab Potential  Good    PT Frequency  Other (comment)   every other week   PT Duration  8 weeks    PT Treatment/Interventions  ADLs/Self Care Home Management;Cryotherapy;Electrical Stimulation;Moist Heat;Therapeutic activities;Therapeutic exercise;Neuromuscular re-education;Patient/family education;Manual techniques;Dry needling;Passive range of motion;Taping;Spinal Manipulations;Joint  Manipulations    PT Next Visit Plan  Assess HEP, progress thoracic mobility and postural strengthening    PT Home Exercise Plan  Side lying thoracic rotation open book stretch, kneeling thoracic extension with ball or counter, supine thoracic extension over foam roller, row with red, horizontal abduction with red (palms up), double ER with red    Consulted and Agree with Plan of Care  Patient       Patient will benefit from skilled therapeutic intervention in order to improve the following deficits and impairments:  Decreased range of motion, Impaired flexibility, Decreased strength, Pain, Postural dysfunction  Visit Diagnosis: Pain in thoracic spine  Cervicalgia  Muscle weakness (generalized)     Problem List Patient Active Problem List   Diagnosis Date Noted  . Symptomatic mammary hypertrophy 03/25/2019  . Neck pain 03/25/2019  . Back pain 03/25/2019  . Generalized anxiety disorder 03/13/2016  . Mild episode of recurrent major depressive disorder ([place]) 03/13/2016  . Intermittent palpitations 03/13/2016    Hilda Blades, PT, DPT, LAT, ATC 04/15/19  8:12 AM Phone: [phone] Fax: [place], [place],  [address] Phone: [phone]   Fax:  [phone]  Name: Jennifer Levy MRN: [id number] Date of Birth: 1990-08-01    PHYSICAL THERAPY DISCHARGE SUMMARY  Visits from Start of Care: 1  Current functional level related to goals / functional outcomes: Goals not assessed, patient did not return to therapy   Remaining deficits: Unknown, pain of upper back   Education / Equipment: HEP Plan: Patient agrees to discharge.  Patient goals were not met. Patient is being discharged due to the patient's request.  ?????     Hilda Blades, PT, DPT, LAT, ATC 05/16/19  9:21 AM Phone: [phone] Fax: [phone]

## 2019-04-24 ENCOUNTER — Telehealth (INDEPENDENT_AMBULATORY_CARE_PROVIDER_SITE_OTHER): Payer: 59 | Admitting: Osteopathic Medicine

## 2019-04-24 ENCOUNTER — Encounter: Payer: Self-pay | Admitting: Osteopathic Medicine

## 2019-04-24 VITALS — Wt 150.0 lb

## 2019-04-24 DIAGNOSIS — F330 Major depressive disorder, recurrent, mild: Secondary | ICD-10-CM

## 2019-04-24 DIAGNOSIS — F39 Unspecified mood [affective] disorder: Secondary | ICD-10-CM

## 2019-04-24 DIAGNOSIS — F411 Generalized anxiety disorder: Secondary | ICD-10-CM

## 2019-04-24 MED ORDER — ARIPIPRAZOLE 10 MG PO TABS: 5.0000 mg | ORAL_TABLET | Freq: Every day | ORAL | 1 refills | Status: DC

## 2019-04-24 NOTE — Progress Notes (Signed)
Virtual Visit via Video (App used: MyChart) Note  I connected with      Jennifer Levy on 04/24/19 at 8:14 AM  by a telemedicine application and verified that I am speaking with the correct person using two identifiers.  Patient is  I am in office   I discussed the limitations of evaluation and management by telemedicine and the availability of in person appointments. The patient expressed understanding and agreed to proceed.  History of Present Illness: Jennifer Levy is a 28 y.o. female who would like to discuss mental health   We are going to try Vraylar, this was cost prohibitive, unable to be approved.  Decided to start Abilify.   Notes reviewed from recent visit with psychiatry 03/27/2019:  Unspecified mood disorder, waiting to see how Abilify does.  Dr. De Nurse started BuSpar 7.5 mg daily  Patient states today that she is doing well, Dr. De Nurse advised to stay on Abilify 5 mg   Depression screen [facility] 2/9 04/24/2019 03/03/2019 02/14/2018  Decreased Interest 1 2 1   Down, Depressed, Hopeless 1 1 1   PHQ - 2 Score 2 3 2   Altered sleeping 1 3 1   Tired, decreased energy 3 3 2   Change in appetite 0 0 0  Feeling bad or failure about yourself  2 3 0  Trouble concentrating 1 1 0  Moving slowly or fidgety/restless 0 1 0  Suicidal thoughts 0 1 0  PHQ-9 Score 9 15 5   Difficult doing work/chores Very difficult Very difficult Somewhat difficult   GAD 7 : Generalized Anxiety Score 04/24/2019 03/03/2019 02/14/2018 04/18/2016  Nervous, Anxious, on Edge 3 3 2 2   Control/stop worrying 3 3 1 2   Worry too much - different things 1 3 1 1   Trouble relaxing 1 3 1 1   Restless 1 1 0 1  Easily annoyed or irritable 1 1 1  0  Afraid - awful might happen 1 1 0 1  Total GAD 7 Score 11 15 6 8   Anxiety Difficulty Very difficult Very difficult Somewhat difficult Somewhat difficult        Observations/Objective: Wt 150 lb (68 kg)   BMI 25.75 kg/m  BP Readings from Last 3  Encounters:  03/25/19 134/87  02/11/19 121/82  02/14/18 102/65   Exam: Normal Speech.  NAD  Lab and Radiology Results No results found for this or any previous visit (from the past 72 hour(s)). No results found.     Assessment and Plan: 28 y.o. female with The primary encounter diagnosis was Generalized anxiety disorder. Diagnoses of Mild episode of recurrent major depressive disorder ([place]) and Mood disorder ([place]) were also pertinent to this visit.  Doing well, following with psychiatry, this appointment probably should have been canceled since she got established with psych, we had it in place from her previous follow-up with me just in case she could not get in to see Dr. De Nurse.  We will not charge for this visit.    Follow Up Instructions: Return for Sometime in the next year when due for annual.    I discussed the assessment and treatment plan with the patient. The patient was provided an opportunity to ask questions and all were answered. The patient agreed with the plan and demonstrated an understanding of the instructions.   The patient was advised to call back or seek an in-person evaluation if any new concerns, if symptoms worsen or if the condition fails to improve as anticipated.  5 minutes of non-face-to-face time  was provided during this encounter.      . . . . . . . . . . . . . Marland Kitchen                   Historical information moved to improve visibility of documentation.  Past Medical History:  Diagnosis Date  . Anorexia nervosa   . Anxiety   . Depression   . Migraine    Past Surgical History:  Procedure Laterality Date  . TMJ ARTHROSCOPY  2012  . WISDOM TOOTH EXTRACTION  2011   Social History   Tobacco Use  . Smoking status: Never Smoker  . Smokeless tobacco: Never Used  Substance Use Topics  . Alcohol use: Yes    Alcohol/week: 1.0 standard drinks    Types: 1 Standard drinks or equivalent per week   family history  includes Alcohol abuse in her brother and maternal uncle; Cancer in her maternal grandmother; Depression in her mother; Diabetes in her maternal grandfather; Stroke in her maternal aunt.  Medications: Current Outpatient Medications  Medication Sig Dispense Refill  . ALPRAZolam (XANAX) 0.5 MG tablet TAKE 1/2 TO 1 TABLET BY MOUTH DAILY AS NEEDED FOR ANXIETY. USE SPARINGLY. 30 TABLETS TO LAST 90 DAYS 30 tablet 0  . ARIPiprazole (ABILIFY) 10 MG tablet Take 1 tablet (10 mg total) by mouth daily. 30 tablet 1  . buPROPion (WELLBUTRIN XL) 150 MG 24 hr tablet Take 1 tablet (150 mg total) by mouth daily. 90 tablet 3  . busPIRone (BUSPAR) 7.5 MG tablet Take 1 tablet (7.5 mg total) by mouth daily. 30 tablet 0  . etonogestrel-ethinyl estradiol (NUVARING) 0.12-0.015 MG/24HR vaginal ring INSERT VAGINALLY AS DIRECTED AND LEAVE IN PLACE FOR 3 CONSECUTIVE WEEKS, THEN REMOVE FOR 1 WEEK, THEN PLACE NEW RING 3 each 3  . ibuprofen (ADVIL) 100 MG tablet Take 100 mg by mouth every 6 (six) hours as needed for fever.    . cariprazine (VRAYLAR) capsule Take 1 capsule (3 mg total) by mouth daily. (Patient not taking: Reported on 04/14/2019) 30 capsule 1   No current facility-administered medications for this visit.   No Known Allergies

## 2019-04-25 ENCOUNTER — Other Ambulatory Visit (HOSPITAL_COMMUNITY): Payer: Self-pay | Admitting: Psychiatry

## 2019-04-25 MED FILL — ETONOGESTREL-ETHINYL ESTRAD: 0.12-0.015 | 84 days supply | Qty: 3 | Fill #2

## 2019-04-26 ENCOUNTER — Encounter: Payer: Self-pay | Admitting: Physical Therapy

## 2019-04-29 ENCOUNTER — Telehealth: Payer: Self-pay | Admitting: Osteopathic Medicine

## 2019-04-29 MED FILL — VRAYLAR 1.5 MG-3 MG PACK: 1.5 & 3 | 7 days supply | Qty: 7 | Fill #0

## 2019-04-29 MED FILL — BUSPIRONE HCL 7.5 MG TABS: 7.5 | 30 days supply | Qty: 30 | Fill #0

## 2019-04-29 NOTE — Telephone Encounter (Signed)
Received fax from [place] that Jennifer Levy was approved from 04/23/2019 through 04/22/2020. Pharmacy notified and form sent to scan.    I have also left a message that the patient prescription has been approved and the pharmacy will order and have it ready tomorrow. Patient was asked to call back with any questions.

## 2019-04-30 ENCOUNTER — Ambulatory Visit: Payer: 59 | Admitting: Physical Therapy

## 2019-04-30 ENCOUNTER — Encounter: Payer: Self-pay | Admitting: Osteopathic Medicine

## 2019-05-14 ENCOUNTER — Encounter (HOSPITAL_COMMUNITY): Payer: Self-pay | Admitting: Psychiatry

## 2019-05-14 ENCOUNTER — Ambulatory Visit (INDEPENDENT_AMBULATORY_CARE_PROVIDER_SITE_OTHER): Payer: 59 | Admitting: Psychiatry

## 2019-05-14 ENCOUNTER — Encounter: Payer: 59 | Admitting: Physical Therapy

## 2019-05-14 DIAGNOSIS — F0630 Mood disorder due to known physiological condition, unspecified: Secondary | ICD-10-CM

## 2019-05-14 DIAGNOSIS — F411 Generalized anxiety disorder: Secondary | ICD-10-CM | POA: Diagnosis not present

## 2019-05-14 DIAGNOSIS — F331 Major depressive disorder, recurrent, moderate: Secondary | ICD-10-CM

## 2019-05-14 MED ORDER — BUSPIRONE HCL 10 MG PO TABS: 10.0000 mg | ORAL_TABLET | Freq: Every day | ORAL | 1 refills | Status: DC | PRN

## 2019-05-14 MED FILL — busPIRone HCL 10 MG TABS: 10 | 30 days supply | Qty: 30 | Fill #0
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
## 2019-05-20 ENCOUNTER — Encounter: Payer: Self-pay | Admitting: Plastic Surgery

## 2019-05-20 ENCOUNTER — Encounter (HOSPITAL_BASED_OUTPATIENT_CLINIC_OR_DEPARTMENT_OTHER): Payer: Self-pay | Admitting: Plastic Surgery

## 2019-05-20 ENCOUNTER — Other Ambulatory Visit: Payer: Self-pay

## 2019-05-20 ENCOUNTER — Ambulatory Visit (INDEPENDENT_AMBULATORY_CARE_PROVIDER_SITE_OTHER): Payer: 59 | Admitting: Plastic Surgery

## 2019-05-20 VITALS — BP 126/87 | HR 96 | Temp 98.0°F | Ht 64.0 in | Wt 157.6 lb

## 2019-05-20 DIAGNOSIS — G8929 Other chronic pain: Secondary | ICD-10-CM

## 2019-05-20 DIAGNOSIS — N62 Hypertrophy of breast: Secondary | ICD-10-CM

## 2019-05-20 DIAGNOSIS — M546 Pain in thoracic spine: Secondary | ICD-10-CM

## 2019-05-20 DIAGNOSIS — M542 Cervicalgia: Secondary | ICD-10-CM

## 2019-05-20 MED ORDER — CEPHALEXIN 500 MG PO CAPS: 500.0000 mg | ORAL_CAPSULE | Freq: Four times a day (QID) | ORAL | 0 refills | Status: AC

## 2019-05-20 MED ORDER — ONDANSETRON HCL 4 MG PO TABS: 4.0000 mg | ORAL_TABLET | Freq: Three times a day (TID) | ORAL | 0 refills | Status: DC | PRN

## 2019-05-20 MED ORDER — HYDROCODONE-ACETAMINOPHEN 5-325 MG PO TABS: 1.0000 | ORAL_TABLET | Freq: Three times a day (TID) | ORAL | 0 refills | Status: AC | PRN

## 2019-05-20 MED FILL — CEPHALEXIN 500 MG CAPSULE: 500 | 3 days supply | Qty: 12 | Fill #0

## 2019-05-20 MED FILL — HYDROCODON-APAP 5-325: 5-325 | 5 days supply | Qty: 15 | Fill #0

## 2019-05-20 MED FILL — ONDANSETRON HCL 4 MG TABLET: 4 | 7 days supply | Qty: 20 | Fill #0

## 2019-05-20 NOTE — H&P (View-Only) (Signed)
No diagnosis found.    Patient ID: Jennifer Levy, female    DOB: March 11, 1991, 29 y.o.   MRN: [id number]   History of Present Illness: Jennifer Levy is a 29 y.o.  female  with a history of mammary hyperplasia for several years.  She presents for preoperative evaluation for upcoming procedure, bilateral breast reduction, scheduled for 03/27/2020 with Dr. Marla Roe.   She has extremely large breasts causing symptoms that include the following: Back pain (upper and lower) and neck pain. She frequently pins bra cups higher on straps for better lift and relief. Notices relief when holding breast up in her hands. Shoulder straps causing grooves, pain occasionally requiring padding. Pain medication is sometimes required with motrin and tylenol.  She has marks and scars on her shoulders due to the weight. Activities that are hindered by enlarged breasts include: running and heavy exercise.  Her breasts are extremely large and fairly symmetric, the left is slightly larger.  Sternal to nipple distance on the right is 28 cm and the left is 29 cm.  The IMF distance is 13 cm.  She is 5 feet 4 inches tall and weighs 157 pounds.  Preoperative bra size equals 34 G cup.  She would like to be a C cup, and would be okay if slightly larger.  Her circumference is 32 to 33 inches.  Mammogram history: Never.  She believes an aunt had breast cancer.   She has no personal history of blood clots but her brother had a blood clot after surgery. She does not smoke. No history of cancer, previous surgeries, traumas, stroke, MI, CHF, lung disease, serious infections, pregnancy, or inflammatory bowel disease. No previous anesthesia.   Past Medical History: Allergies: No Known Allergies  Current Medications:  Current Outpatient Medications:  .  ALPRAZolam (XANAX) 0.5 MG tablet, TAKE 1/2 TO 1 TABLET BY MOUTH DAILY AS NEEDED FOR ANXIETY. USE SPARINGLY. 30 TABLETS TO LAST 90 DAYS, Disp: 30 tablet, Rfl: 0 .  ARIPiprazole  (ABILIFY) 10 MG tablet, Take 0.5 tablets (5 mg total) by mouth daily., Disp: 30 tablet, Rfl: 1 .  buPROPion (WELLBUTRIN XL) 150 MG 24 hr tablet, Take 1 tablet (150 mg total) by mouth daily., Disp: 90 tablet, Rfl: 3 .  busPIRone (BUSPAR) 10 MG tablet, Take 1 tablet (10 mg total) by mouth daily as needed., Disp: 30 tablet, Rfl: 1 .  etonogestrel-ethinyl estradiol (NUVARING) 0.12-0.015 MG/24HR vaginal ring, INSERT VAGINALLY AS DIRECTED AND LEAVE IN PLACE FOR 3 CONSECUTIVE WEEKS, THEN REMOVE FOR 1 WEEK, THEN PLACE NEW RING, Disp: 3 each, Rfl: 3 .  ibuprofen (ADVIL) 100 MG tablet, Take 100 mg by mouth every 6 (six) hours as needed for fever., Disp: , Rfl:   Past Medical Problems: Past Medical History:  Diagnosis Date  . Anorexia nervosa   . Anxiety   . Depression   . Migraine     Past Surgical History: Past Surgical History:  Procedure Laterality Date  . TMJ ARTHROSCOPY  2012  . WISDOM TOOTH EXTRACTION  2011    Social History: Social History   Socioeconomic History  . Marital status: Single    Spouse name: Not on file  . Number of children: Not on file  . Years of education: Not on file  . Highest education level: Not on file  Occupational History  . Not on file  Tobacco Use  . Smoking status: Never Smoker  . Smokeless tobacco: Never Used  Substance and Sexual Activity  . Alcohol  use: Yes    Alcohol/week: 1.0 standard drinks    Types: 1 Standard drinks or equivalent per week  . Drug use: Not Currently  . Sexual activity: Yes    Partners: Male    Comment: NuvaRing  Other Topics Concern  . Not on file  Social History Narrative  . Not on file   Social Determinants of Health   Financial Resource Strain:   . Difficulty of Paying Living Expenses: Not on file  Food Insecurity:   . Worried About Charity fundraiser in the Last Year: Not on file  . Ran Out of Food in the Last Year: Not on file  Transportation Needs:   . Lack of Transportation (Medical): Not on file  . Lack  of Transportation (Non-Medical): Not on file  Physical Activity:   . Days of Exercise per Week: Not on file  . Minutes of Exercise per Session: Not on file  Stress:   . Feeling of Stress : Not on file  Social Connections:   . Frequency of Communication with Friends and Family: Not on file  . Frequency of Social Gatherings with Friends and Family: Not on file  . Attends Religious Services: Not on file  . Active Member of Clubs or Organizations: Not on file  . Attends Archivist Meetings: Not on file  . Marital Status: Not on file  Intimate Partner Violence:   . Fear of Current or Ex-Partner: Not on file  . Emotionally Abused: Not on file  . Physically Abused: Not on file  . Sexually Abused: Not on file    Family History: Family History  Problem Relation Age of Onset  . Depression Mother   . Alcohol abuse Brother   . Stroke Maternal Aunt   . Alcohol abuse Maternal Uncle   . Diabetes Maternal Grandfather   . Cancer Maternal Grandmother        breast     Review of Systems: Review of Systems  Constitutional: Negative for chills and fever.  HENT: Negative for congestion and sore throat.   Respiratory: Negative for cough and shortness of breath.   Cardiovascular: Negative for chest pain.  Gastrointestinal: Negative for abdominal pain, nausea and vomiting.  Musculoskeletal: Positive for back pain and neck pain. Negative for joint pain and myalgias.  Skin: Negative for itching and rash.    Physical Exam: Vital Signs BP 126/87 (BP Location: Left Arm, Patient Position: Sitting, Cuff Size: Normal)   Pulse 96   Temp 98 F (36.7 C) (Temporal)   Ht 5\' 4"  (1.626 m)   Wt 157 lb 9.6 oz (71.5 kg)   LMP 04/15/2019 (Approximate)   SpO2 99%   BMI 27.05 kg/m  Physical Exam Constitutional:      Appearance: Normal appearance. She is normal weight.  HENT:     Head: Normocephalic and atraumatic.  Eyes:     Extraocular Movements: Extraocular movements intact.    Cardiovascular:     Rate and Rhythm: Normal rate and regular rhythm.     Pulses: Normal pulses.     Heart sounds: Normal heart sounds.  Pulmonary:     Effort: Pulmonary effort is normal.     Breath sounds: Normal breath sounds. No wheezing, rhonchi or rales.  Abdominal:     General: Bowel sounds are normal.     Palpations: Abdomen is soft.  Musculoskeletal:        General: No swelling. Normal range of motion.     Cervical back: Normal range  of motion.  Skin:    General: Skin is warm and dry.     Coloration: Skin is not pale.     Findings: No erythema or rash.     Comments: Stretch makes on breasts.  Neurological:     General: No focal deficit present.     Mental Status: She is alert and oriented to person, place, and time.  Psychiatric:        Mood and Affect: Mood normal.        Behavior: Behavior normal.        Thought Content: Thought content normal.        Judgment: Judgment normal.     Assessment/Plan:  Jennifer Levy is scheduled for bilateral breast reduction with Dr. Marla Roe.  Risks, benefits, and alternatives of procedure discussed, questions answered and consent obtained.    The risk that can be encountered with breast reduction were discussed and include the following but not limited to these:  Breast asymmetry, fluid accumulation, firmness of the breast, inability to breast feed, loss of nipple or areola, skin loss, decrease or no nipple sensation, fat necrosis of the breast tissue, bleeding, infection, healing delay.  There are risks of anesthesia, changes to skin sensation and injury to nerves or blood vessels.  The muscle can be temporarily or permanently injured.  You may have an allergic reaction to tape, suture, glue, blood products which can result in skin discoloration, swelling, pain, skin lesions, poor healing.  Any of these can lead to the need for revisonal surgery or stage procedures.  A reduction has potential to interfere with diagnostic procedures.   Nipple or breast piercing can increase risks of infection.  This procedure is best done when the breast is fully developed.  Changes in the breast will continue to occur over time.  Pregnancy can alter the outcomes of previous breast reduction surgery, weight gain and weigh loss can also effect the long term appearance.   She is a non smoker. No previous mammogram. Believes an Aunt had breast cancer.   Caprini Risk Score: High. Risks include 29 year old female using nuvaring birth control, length of planned surgery, Brother had a blood clot after a surgery, BMI 27.05 Recommend mechanical and pharmacological prophylaxis.   She is currently a 34 G cup and would like to be a C cup, would be OK if a little larger. Photos in chart from 03/25/2019.  Post-op RXs sent to pharmacy for Keflex, Zofran, and Norco.  Call office with any questions/concerns.  The [place] was signed into law in 2016 which includes the topic of electronic health records.  This provides immediate access to information in MyChart.  This includes consultation notes, operative notes, office notes, lab results and pathology reports.  If you have any questions about what you read please let us know at your next visit or call us at the office.  We are right here with you.   Electronically signed by: Threasa Heads, PA-C 05/20/2019 3:06 PM

## 2019-05-20 NOTE — Progress Notes (Signed)
No diagnosis found.    Patient ID: Jennifer Levy, female    DOB: 07/17/1990, 29 y.o.   MRN: [id number]   History of Present Illness: Jennifer Levy is a 29 y.o.  female  with a history of mammary hyperplasia for several years.  She presents for preoperative evaluation for upcoming procedure, bilateral breast reduction, scheduled for 03/27/2020 with Dr. Marla Roe.   She has extremely large breasts causing symptoms that include the following: Back pain (upper and lower) and neck pain. She frequently pins bra cups higher on straps for better lift and relief. Notices relief when holding breast up in her hands. Shoulder straps causing grooves, pain occasionally requiring padding. Pain medication is sometimes required with motrin and tylenol.  She has marks and scars on her shoulders due to the weight. Activities that are hindered by enlarged breasts include: running and heavy exercise.  Her breasts are extremely large and fairly symmetric, the left is slightly larger.  Sternal to nipple distance on the right is 28 cm and the left is 29 cm.  The IMF distance is 13 cm.  She is 5 feet 4 inches tall and weighs 157 pounds.  Preoperative bra size equals 34 G cup.  She would like to be a C cup, and would be okay if slightly larger.  Her circumference is 32 to 33 inches.  Mammogram history: Never.  She believes an aunt had breast cancer.   She has no personal history of blood clots but her brother had a blood clot after surgery. She does not smoke. No history of cancer, previous surgeries, traumas, stroke, MI, CHF, lung disease, serious infections, pregnancy, or inflammatory bowel disease. No previous anesthesia.   Past Medical History: Allergies: No Known Allergies  Current Medications:  Current Outpatient Medications:  .  ALPRAZolam (XANAX) 0.5 MG tablet, TAKE 1/2 TO 1 TABLET BY MOUTH DAILY AS NEEDED FOR ANXIETY. USE SPARINGLY. 30 TABLETS TO LAST 90 DAYS, Disp: 30 tablet, Rfl: 0 .  ARIPiprazole  (ABILIFY) 10 MG tablet, Take 0.5 tablets (5 mg total) by mouth daily., Disp: 30 tablet, Rfl: 1 .  buPROPion (WELLBUTRIN XL) 150 MG 24 hr tablet, Take 1 tablet (150 mg total) by mouth daily., Disp: 90 tablet, Rfl: 3 .  busPIRone (BUSPAR) 10 MG tablet, Take 1 tablet (10 mg total) by mouth daily as needed., Disp: 30 tablet, Rfl: 1 .  etonogestrel-ethinyl estradiol (NUVARING) 0.12-0.015 MG/24HR vaginal ring, INSERT VAGINALLY AS DIRECTED AND LEAVE IN PLACE FOR 3 CONSECUTIVE WEEKS, THEN REMOVE FOR 1 WEEK, THEN PLACE NEW RING, Disp: 3 each, Rfl: 3 .  ibuprofen (ADVIL) 100 MG tablet, Take 100 mg by mouth every 6 (six) hours as needed for fever., Disp: , Rfl:   Past Medical Problems: Past Medical History:  Diagnosis Date  . Anorexia nervosa   . Anxiety   . Depression   . Migraine     Past Surgical History: Past Surgical History:  Procedure Laterality Date  . TMJ ARTHROSCOPY  2012  . WISDOM TOOTH EXTRACTION  2011    Social History: Social History   Socioeconomic History  . Marital status: Single    Spouse name: Not on file  . Number of children: Not on file  . Years of education: Not on file  . Highest education level: Not on file  Occupational History  . Not on file  Tobacco Use  . Smoking status: Never Smoker  . Smokeless tobacco: Never Used  Substance and Sexual Activity  . Alcohol  use: Yes    Alcohol/week: 1.0 standard drinks    Types: 1 Standard drinks or equivalent per week  . Drug use: Not Currently  . Sexual activity: Yes    Partners: Male    Comment: NuvaRing  Other Topics Concern  . Not on file  Social History Narrative  . Not on file   Social Determinants of Health   Financial Resource Strain:   . Difficulty of Paying Living Expenses: Not on file  Food Insecurity:   . Worried About Charity fundraiser in the Last Year: Not on file  . Ran Out of Food in the Last Year: Not on file  Transportation Needs:   . Lack of Transportation (Medical): Not on file  . Lack  of Transportation (Non-Medical): Not on file  Physical Activity:   . Days of Exercise per Week: Not on file  . Minutes of Exercise per Session: Not on file  Stress:   . Feeling of Stress : Not on file  Social Connections:   . Frequency of Communication with Friends and Family: Not on file  . Frequency of Social Gatherings with Friends and Family: Not on file  . Attends Religious Services: Not on file  . Active Member of Clubs or Organizations: Not on file  . Attends Archivist Meetings: Not on file  . Marital Status: Not on file  Intimate Partner Violence:   . Fear of Current or Ex-Partner: Not on file  . Emotionally Abused: Not on file  . Physically Abused: Not on file  . Sexually Abused: Not on file    Family History: Family History  Problem Relation Age of Onset  . Depression Mother   . Alcohol abuse Brother   . Stroke Maternal Aunt   . Alcohol abuse Maternal Uncle   . Diabetes Maternal Grandfather   . Cancer Maternal Grandmother        breast     Review of Systems: Review of Systems  Constitutional: Negative for chills and fever.  HENT: Negative for congestion and sore throat.   Respiratory: Negative for cough and shortness of breath.   Cardiovascular: Negative for chest pain.  Gastrointestinal: Negative for abdominal pain, nausea and vomiting.  Musculoskeletal: Positive for back pain and neck pain. Negative for joint pain and myalgias.  Skin: Negative for itching and rash.    Physical Exam: Vital Signs BP 126/87 (BP Location: Left Arm, Patient Position: Sitting, Cuff Size: Normal)   Pulse 96   Temp 98 F (36.7 C) (Temporal)   Ht 5\' 4"  (1.626 m)   Wt 157 lb 9.6 oz (71.5 kg)   LMP 04/15/2019 (Approximate)   SpO2 99%   BMI 27.05 kg/m  Physical Exam Constitutional:      Appearance: Normal appearance. She is normal weight.  HENT:     Head: Normocephalic and atraumatic.  Eyes:     Extraocular Movements: Extraocular movements intact.    Cardiovascular:     Rate and Rhythm: Normal rate and regular rhythm.     Pulses: Normal pulses.     Heart sounds: Normal heart sounds.  Pulmonary:     Effort: Pulmonary effort is normal.     Breath sounds: Normal breath sounds. No wheezing, rhonchi or rales.  Abdominal:     General: Bowel sounds are normal.     Palpations: Abdomen is soft.  Musculoskeletal:        General: No swelling. Normal range of motion.     Cervical back: Normal range  of motion.  Skin:    General: Skin is warm and dry.     Coloration: Skin is not pale.     Findings: No erythema or rash.     Comments: Stretch makes on breasts.  Neurological:     General: No focal deficit present.     Mental Status: She is alert and oriented to person, place, and time.  Psychiatric:        Mood and Affect: Mood normal.        Behavior: Behavior normal.        Thought Content: Thought content normal.        Judgment: Judgment normal.     Assessment/Plan:  Ms. Weisheit is scheduled for bilateral breast reduction with Dr. Marla Roe.  Risks, benefits, and alternatives of procedure discussed, questions answered and consent obtained.    The risk that can be encountered with breast reduction were discussed and include the following but not limited to these:  Breast asymmetry, fluid accumulation, firmness of the breast, inability to breast feed, loss of nipple or areola, skin loss, decrease or no nipple sensation, fat necrosis of the breast tissue, bleeding, infection, healing delay.  There are risks of anesthesia, changes to skin sensation and injury to nerves or blood vessels.  The muscle can be temporarily or permanently injured.  You may have an allergic reaction to tape, suture, glue, blood products which can result in skin discoloration, swelling, pain, skin lesions, poor healing.  Any of these can lead to the need for revisonal surgery or stage procedures.  A reduction has potential to interfere with diagnostic procedures.   Nipple or breast piercing can increase risks of infection.  This procedure is best done when the breast is fully developed.  Changes in the breast will continue to occur over time.  Pregnancy can alter the outcomes of previous breast reduction surgery, weight gain and weigh loss can also effect the long term appearance.   She is a non smoker. No previous mammogram. Believes an Aunt had breast cancer.   Caprini Risk Score: High. Risks include 29 year old female using nuvaring birth control, length of planned surgery, Brother had a blood clot after a surgery, BMI 27.05 Recommend mechanical and pharmacological prophylaxis.   She is currently a 34 G cup and would like to be a C cup, would be OK if a little larger. Photos in chart from 03/25/2019.  Post-op RXs sent to pharmacy for Keflex, Zofran, and Norco.  Call office with any questions/concerns.  The [place] was signed into law in 2016 which includes the topic of electronic health records.  This provides immediate access to information in MyChart.  This includes consultation notes, operative notes, office notes, lab results and pathology reports.  If you have any questions about what you read please let us know at your next visit or call us at the office.  We are right here with you.   Electronically signed by: Threasa Heads, PA-C 05/20/2019 3:06 PM

## 2019-05-24 ENCOUNTER — Other Ambulatory Visit (HOSPITAL_COMMUNITY)
Admission: RE | Admit: 2019-05-24 | Discharge: 2019-05-24 | Disposition: A | Discharge status: Home / Self Care | Payer: 59 | Source: Ambulatory Visit | Attending: Plastic Surgery | Admitting: Plastic Surgery

## 2019-05-24 DIAGNOSIS — Z20822 Contact with and (suspected) exposure to covid-19: Secondary | ICD-10-CM | POA: Diagnosis not present

## 2019-05-24 DIAGNOSIS — Z01812 Encounter for preprocedural laboratory examination: Secondary | ICD-10-CM | POA: Diagnosis not present

## 2019-05-25 LAB — NOVEL CORONAVIRUS, NAA (HOSP ORDER, SEND-OUT TO REF LAB; TAT 18-24 HRS): SARS-CoV-2, NAA: NOT DETECTED

## 2019-05-26 MED FILL — busPIRone HCL 10 MG TABS: 10 | 30 days supply | Qty: 30 | Fill #0

## 2019-05-27 ENCOUNTER — Telehealth: Payer: Self-pay | Admitting: Osteopathic Medicine

## 2019-05-27 NOTE — Anesthesia Preprocedure Evaluation (Addendum)
Anesthesia Evaluation  Patient identified by MRN, date of birth, ID band Patient awake    Reviewed: Allergy & Precautions, NPO status , Patient's Chart, lab work & pertinent test results  Airway Mallampati: I       Dental no notable dental hx. (+) Teeth Intact   Pulmonary neg pulmonary ROS,    Pulmonary exam normal breath sounds clear to auscultation       Cardiovascular negative cardio ROS Normal cardiovascular exam Rhythm:Regular Rate:Normal     Neuro/Psych    GI/Hepatic negative GI ROS, Neg liver ROS,   Endo/Other  negative endocrine ROS  Renal/GU negative Renal ROS  negative genitourinary   Musculoskeletal negative musculoskeletal ROS (+)   Abdominal Normal abdominal exam  (+)   Peds  Hematology negative hematology ROS (+)   Anesthesia Other Findings   Reproductive/Obstetrics                            Anesthesia Physical Anesthesia Plan  ASA: II  Anesthesia Plan: General   Post-op Pain Management:    Induction: Intravenous  PONV Risk Score and Plan: 3 and Ondansetron, Dexamethasone and Midazolam  Airway Management Planned: LMA  Additional Equipment: None  Intra-op Plan:   Post-operative Plan: Extubation in OR  Informed Consent: I have reviewed the patients History and Physical, chart, labs and discussed the procedure including the risks, benefits and alternatives for the proposed anesthesia with the patient or authorized representative who has indicated his/her understanding and acceptance.     Dental advisory given  Plan Discussed with: CRNA  Anesthesia Plan Comments:        Anesthesia Quick Evaluation

## 2019-05-27 NOTE — Telephone Encounter (Signed)
Received fax from [employer] that [employer] requires a PA. Information has been sent to the insurance company. Awaiting determination.

## 2019-05-28 ENCOUNTER — Ambulatory Visit (HOSPITAL_BASED_OUTPATIENT_CLINIC_OR_DEPARTMENT_OTHER)
Admission: RE | Admit: 2019-05-28 | Discharge: 2019-05-28 | Disposition: A | Discharge status: Home / Self Care | Payer: 59 | Attending: Plastic Surgery | Admitting: Plastic Surgery

## 2019-05-28 ENCOUNTER — Ambulatory Visit (HOSPITAL_BASED_OUTPATIENT_CLINIC_OR_DEPARTMENT_OTHER): Payer: 59 | Admitting: Anesthesiology

## 2019-05-28 ENCOUNTER — Encounter (HOSPITAL_BASED_OUTPATIENT_CLINIC_OR_DEPARTMENT_OTHER)
Admission: RE | Disposition: A | Discharge status: Home / Self Care | Payer: Self-pay | Source: Home / Self Care | Attending: Plastic Surgery

## 2019-05-28 ENCOUNTER — Other Ambulatory Visit: Payer: Self-pay

## 2019-05-28 ENCOUNTER — Encounter (HOSPITAL_BASED_OUTPATIENT_CLINIC_OR_DEPARTMENT_OTHER): Payer: Self-pay | Admitting: Plastic Surgery

## 2019-05-28 DIAGNOSIS — N62 Hypertrophy of breast: Secondary | ICD-10-CM | POA: Insufficient documentation

## 2019-05-28 DIAGNOSIS — N6082 Other benign mammary dysplasias of left breast: Secondary | ICD-10-CM | POA: Diagnosis not present

## 2019-05-28 DIAGNOSIS — Z793 Long term (current) use of hormonal contraceptives: Secondary | ICD-10-CM | POA: Insufficient documentation

## 2019-05-28 DIAGNOSIS — F418 Other specified anxiety disorders: Secondary | ICD-10-CM | POA: Diagnosis not present

## 2019-05-28 DIAGNOSIS — M546 Pain in thoracic spine: Secondary | ICD-10-CM | POA: Diagnosis not present

## 2019-05-28 DIAGNOSIS — G8929 Other chronic pain: Secondary | ICD-10-CM | POA: Diagnosis not present

## 2019-05-28 DIAGNOSIS — M542 Cervicalgia: Secondary | ICD-10-CM | POA: Insufficient documentation

## 2019-05-28 DIAGNOSIS — F419 Anxiety disorder, unspecified: Secondary | ICD-10-CM | POA: Diagnosis not present

## 2019-05-28 DIAGNOSIS — N6022 Fibroadenosis of left breast: Secondary | ICD-10-CM | POA: Diagnosis not present

## 2019-05-28 DIAGNOSIS — F329 Major depressive disorder, single episode, unspecified: Secondary | ICD-10-CM | POA: Diagnosis not present

## 2019-05-28 DIAGNOSIS — N6011 Diffuse cystic mastopathy of right breast: Secondary | ICD-10-CM | POA: Diagnosis not present

## 2019-05-28 DIAGNOSIS — M545 Low back pain: Secondary | ICD-10-CM | POA: Insufficient documentation

## 2019-05-28 DIAGNOSIS — D241 Benign neoplasm of right breast: Secondary | ICD-10-CM | POA: Diagnosis not present

## 2019-05-28 DIAGNOSIS — M549 Dorsalgia, unspecified: Secondary | ICD-10-CM | POA: Diagnosis not present

## 2019-05-28 DIAGNOSIS — Z79899 Other long term (current) drug therapy: Secondary | ICD-10-CM | POA: Diagnosis not present

## 2019-05-28 HISTORY — PX: BREAST REDUCTION SURGERY: SHX8

## 2019-05-28 SURGERY — MAMMOPLASTY, REDUCTION
Anesthesia: General | Site: Breast | Laterality: Bilateral

## 2019-05-28 MED ORDER — SUCCINYLCHOLINE CHLORIDE 200 MG/10ML IV SOSY
PREFILLED_SYRINGE | INTRAVENOUS | Status: AC
  Filled 2019-05-28: qty 10

## 2019-05-28 MED ORDER — LIDOCAINE-EPINEPHRINE 1 %-1:100000 IJ SOLN
INTRAMUSCULAR | Status: DC | PRN
  Administered 2019-05-28: 40 mL

## 2019-05-28 MED ORDER — ACETAMINOPHEN 325 MG PO TABS: 325.0000 mg | ORAL_TABLET | ORAL | Status: DC | PRN

## 2019-05-28 MED ORDER — SUFENTANIL CITRATE 50 MCG/ML IV SOLN
INTRAVENOUS | Status: AC
  Filled 2019-05-28: qty 1

## 2019-05-28 MED ORDER — LIDOCAINE-EPINEPHRINE 1 %-1:100000 IJ SOLN
INTRAMUSCULAR | Status: AC
  Filled 2019-05-28: qty 1

## 2019-05-28 MED ORDER — OXYCODONE HCL 5 MG PO TABS
ORAL_TABLET | ORAL | Status: AC
  Filled 2019-05-28: qty 1

## 2019-05-28 MED ORDER — LACTATED RINGERS IV SOLN: INTRAVENOUS | Status: DC

## 2019-05-28 MED ORDER — DEXAMETHASONE SODIUM PHOSPHATE 10 MG/ML IJ SOLN
INTRAMUSCULAR | Status: AC
  Filled 2019-05-28: qty 1

## 2019-05-28 MED ORDER — LIDOCAINE-EPINEPHRINE 0.5 %-1:200000 IJ SOLN
INTRAMUSCULAR | Status: AC
  Filled 2019-05-28: qty 1

## 2019-05-28 MED ORDER — ONDANSETRON HCL 4 MG/2ML IJ SOLN
INTRAMUSCULAR | Status: AC
  Filled 2019-05-28: qty 2

## 2019-05-28 MED ORDER — SODIUM CHLORIDE 0.9% FLUSH: 3.0000 mL | INTRAVENOUS | Status: DC | PRN

## 2019-05-28 MED ORDER — BUPIVACAINE HCL (PF) 0.25 % IJ SOLN
INTRAMUSCULAR | Status: DC | PRN
  Administered 2019-05-28: 40 mL

## 2019-05-28 MED ORDER — PROPOFOL 10 MG/ML IV BOLUS
INTRAVENOUS | Status: DC | PRN
  Administered 2019-05-28: 200 mg via INTRAVENOUS

## 2019-05-28 MED ORDER — DEXAMETHASONE SODIUM PHOSPHATE 4 MG/ML IJ SOLN
INTRAMUSCULAR | Status: DC | PRN
  Administered 2019-05-28: 10 mg via INTRAVENOUS

## 2019-05-28 MED ORDER — CHLORHEXIDINE GLUCONATE CLOTH 2 % EX PADS: 6.0000 | MEDICATED_PAD | Freq: Once | CUTANEOUS | Status: DC

## 2019-05-28 MED ORDER — ROCURONIUM BROMIDE 100 MG/10ML IV SOLN
INTRAVENOUS | Status: DC | PRN
  Administered 2019-05-28: 60 mg via INTRAVENOUS

## 2019-05-28 MED ORDER — LIDOCAINE HCL (PF) 1 % IJ SOLN
INTRAMUSCULAR | Status: AC
  Filled 2019-05-28: qty 30

## 2019-05-28 MED ORDER — ACETAMINOPHEN 160 MG/5ML PO SOLN: 325.0000 mg | ORAL | Status: DC | PRN

## 2019-05-28 MED ORDER — EPHEDRINE 5 MG/ML INJ
INTRAVENOUS | Status: AC
  Filled 2019-05-28: qty 10

## 2019-05-28 MED ORDER — ACETAMINOPHEN 325 MG PO TABS: 650.0000 mg | ORAL_TABLET | ORAL | Status: DC | PRN

## 2019-05-28 MED ORDER — SUGAMMADEX SODIUM 200 MG/2ML IV SOLN
INTRAVENOUS | Status: DC | PRN
  Administered 2019-05-28: 150 mg via INTRAVENOUS

## 2019-05-28 MED ORDER — PHENYLEPHRINE 40 MCG/ML (10ML) SYRINGE FOR IV PUSH (FOR BLOOD PRESSURE SUPPORT)
PREFILLED_SYRINGE | INTRAVENOUS | Status: AC
  Filled 2019-05-28: qty 10

## 2019-05-28 MED ORDER — ONDANSETRON HCL 4 MG/2ML IJ SOLN: 4.0000 mg | Freq: Once | INTRAMUSCULAR | Status: DC | PRN

## 2019-05-28 MED ORDER — OXYCODONE HCL 5 MG/5ML PO SOLN: 5.0000 mg | Freq: Once | ORAL | Status: AC | PRN

## 2019-05-28 MED ORDER — OXYCODONE HCL 5 MG PO TABS: 5.0000 mg | ORAL_TABLET | ORAL | Status: DC | PRN

## 2019-05-28 MED ORDER — SODIUM CHLORIDE 0.9 % IV SOLN: 250.0000 mL | INTRAVENOUS | Status: DC | PRN

## 2019-05-28 MED ORDER — PROPOFOL 500 MG/50ML IV EMUL
INTRAVENOUS | Status: AC
  Filled 2019-05-28: qty 50

## 2019-05-28 MED ORDER — ACETAMINOPHEN 650 MG RE SUPP: 650.0000 mg | RECTAL | Status: DC | PRN

## 2019-05-28 MED ORDER — KETOROLAC TROMETHAMINE 30 MG/ML IJ SOLN: 30.0000 mg | Freq: Once | INTRAMUSCULAR | Status: DC | PRN

## 2019-05-28 MED ORDER — DIPHENHYDRAMINE HCL 50 MG/ML IJ SOLN
INTRAMUSCULAR | Status: DC | PRN
  Administered 2019-05-28: 6.25 mg via INTRAVENOUS

## 2019-05-28 MED ORDER — MEPERIDINE HCL 25 MG/ML IJ SOLN: 6.2500 mg | INTRAMUSCULAR | Status: DC | PRN

## 2019-05-28 MED ORDER — EPINEPHRINE PF 1 MG/ML IJ SOLN
INTRAMUSCULAR | Status: AC
  Filled 2019-05-28: qty 1

## 2019-05-28 MED ORDER — SUFENTANIL CITRATE 50 MCG/ML IV SOLN
INTRAVENOUS | Status: DC | PRN
  Administered 2019-05-28 (×3): 10 ug via INTRAVENOUS
  Administered 2019-05-28 (×2): 5 ug via INTRAVENOUS

## 2019-05-28 MED ORDER — FENTANYL CITRATE (PF) 100 MCG/2ML IJ SOLN
INTRAMUSCULAR | Status: AC
  Filled 2019-05-28: qty 2

## 2019-05-28 MED ORDER — ROCURONIUM BROMIDE 10 MG/ML (PF) SYRINGE
PREFILLED_SYRINGE | INTRAVENOUS | Status: AC
  Filled 2019-05-28: qty 10

## 2019-05-28 MED ORDER — MIDAZOLAM HCL 2 MG/2ML IJ SOLN
1.0000 mg | INTRAMUSCULAR | Status: DC | PRN
  Administered 2019-05-28: 2 mg via INTRAVENOUS

## 2019-05-28 MED ORDER — ONDANSETRON HCL 4 MG/2ML IJ SOLN
INTRAMUSCULAR | Status: DC | PRN
  Administered 2019-05-28: 4 mg via INTRAVENOUS

## 2019-05-28 MED ORDER — FENTANYL CITRATE (PF) 100 MCG/2ML IJ SOLN: 50.0000 ug | INTRAMUSCULAR | Status: DC | PRN

## 2019-05-28 MED ORDER — LIDOCAINE HCL (CARDIAC) PF 100 MG/5ML IV SOSY
PREFILLED_SYRINGE | INTRAVENOUS | Status: DC | PRN
  Administered 2019-05-28: 60 mg via INTRAVENOUS

## 2019-05-28 MED ORDER — MIDAZOLAM HCL 2 MG/2ML IJ SOLN
INTRAMUSCULAR | Status: AC
  Filled 2019-05-28: qty 2

## 2019-05-28 MED ORDER — SODIUM CHLORIDE 0.9% FLUSH: 3.0000 mL | Freq: Two times a day (BID) | INTRAVENOUS | Status: DC

## 2019-05-28 MED ORDER — NITROGLYCERIN 2 % TD OINT
TOPICAL_OINTMENT | TRANSDERMAL | Status: AC
  Filled 2019-05-28: qty 30

## 2019-05-28 MED ORDER — LIDOCAINE 2% (20 MG/ML) 5 ML SYRINGE
INTRAMUSCULAR | Status: AC
  Filled 2019-05-28: qty 5

## 2019-05-28 MED ORDER — PROPOFOL 500 MG/50ML IV EMUL
INTRAVENOUS | Status: DC | PRN
  Administered 2019-05-28: 15 ug/kg/min via INTRAVENOUS

## 2019-05-28 MED ORDER — CEFAZOLIN SODIUM-DEXTROSE 2-4 GM/100ML-% IV SOLN
INTRAVENOUS | Status: AC
  Filled 2019-05-28: qty 100

## 2019-05-28 MED ORDER — BUPIVACAINE HCL (PF) 0.25 % IJ SOLN
INTRAMUSCULAR | Status: AC
  Filled 2019-05-28: qty 30

## 2019-05-28 MED ORDER — MORPHINE SULFATE (PF) 4 MG/ML IV SOLN: 2.0000 mg | INTRAVENOUS | Status: DC | PRN

## 2019-05-28 MED ORDER — CEFAZOLIN SODIUM-DEXTROSE 2-4 GM/100ML-% IV SOLN
2.0000 g | INTRAVENOUS | Status: AC
  Administered 2019-05-28: 2 g via INTRAVENOUS

## 2019-05-28 MED ORDER — DIPHENHYDRAMINE HCL 50 MG/ML IJ SOLN
INTRAMUSCULAR | Status: AC
  Filled 2019-05-28: qty 1

## 2019-05-28 MED ORDER — FENTANYL CITRATE (PF) 100 MCG/2ML IJ SOLN
25.0000 ug | INTRAMUSCULAR | Status: DC | PRN
  Administered 2019-05-28: 25 ug via INTRAVENOUS
  Administered 2019-05-28: 50 ug via INTRAVENOUS

## 2019-05-28 MED ORDER — OXYCODONE HCL 5 MG PO TABS
5.0000 mg | ORAL_TABLET | Freq: Once | ORAL | Status: AC | PRN
  Administered 2019-05-28: 5 mg via ORAL

## 2019-05-28 SURGICAL SUPPLY — 67 items
BAG DECANTER FOR FLEXI CONT (MISCELLANEOUS) ×2 IMPLANT
BINDER BREAST LRG (GAUZE/BANDAGES/DRESSINGS) IMPLANT
BINDER BREAST MEDIUM (GAUZE/BANDAGES/DRESSINGS) IMPLANT
BINDER BREAST XLRG (GAUZE/BANDAGES/DRESSINGS) ×1 IMPLANT
BINDER BREAST XXLRG (GAUZE/BANDAGES/DRESSINGS) IMPLANT
BIOPATCH RED 1 DISK 7.0 (GAUZE/BANDAGES/DRESSINGS) IMPLANT
BLADE HEX COATED 2.75 (ELECTRODE) ×2 IMPLANT
BLADE KNIFE PERSONA 10 (BLADE) ×4 IMPLANT
BLADE SURG 15 STRL LF DISP TIS (BLADE) IMPLANT
BLADE SURG 15 STRL SS (BLADE)
CANISTER SUCT 1200ML W/VALVE (MISCELLANEOUS) ×2 IMPLANT
COVER BACK TABLE 60X90IN (DRAPES) ×2 IMPLANT
COVER MAYO STAND STRL (DRAPES) ×2 IMPLANT
COVER WAND RF STERILE (DRAPES) IMPLANT
DECANTER SPIKE VIAL GLASS SM (MISCELLANEOUS) IMPLANT
DERMABOND ADVANCED (GAUZE/BANDAGES/DRESSINGS) ×3
DERMABOND ADVANCED .7 DNX12 (GAUZE/BANDAGES/DRESSINGS) IMPLANT
DRAIN CHANNEL 19F RND (DRAIN) IMPLANT
DRAPE LAPAROSCOPIC ABDOMINAL (DRAPES) ×2 IMPLANT
DRSG PAD ABDOMINAL 8X10 ST (GAUZE/BANDAGES/DRESSINGS) ×4 IMPLANT
ELECT BLADE 4.0 EZ CLEAN MEGAD (MISCELLANEOUS) ×2
ELECT REM PT RETURN 9FT ADLT (ELECTROSURGICAL) ×2
ELECTRODE BLDE 4.0 EZ CLN MEGD (MISCELLANEOUS) IMPLANT
ELECTRODE REM PT RTRN 9FT ADLT (ELECTROSURGICAL) ×1 IMPLANT
EVACUATOR SILICONE 100CC (DRAIN) IMPLANT
GAUZE SPONGE 4X4 12PLY STRL LF (GAUZE/BANDAGES/DRESSINGS) IMPLANT
GLOVE BIO SURGEON STRL SZ 6.5 (GLOVE) ×7 IMPLANT
GLOVE BIO SURGEON STRL SZ7 (GLOVE) ×1 IMPLANT
GLOVE BIOGEL PI IND STRL 6.5 (GLOVE) IMPLANT
GLOVE BIOGEL PI IND STRL 7.0 (GLOVE) IMPLANT
GLOVE BIOGEL PI INDICATOR 6.5 (GLOVE) ×1
GLOVE BIOGEL PI INDICATOR 7.0 (GLOVE) ×1
GLOVE ECLIPSE 6.5 STRL STRAW (GLOVE) ×1 IMPLANT
GOWN STRL REUS W/ TWL LRG LVL3 (GOWN DISPOSABLE) ×3 IMPLANT
GOWN STRL REUS W/TWL LRG LVL3 (GOWN DISPOSABLE) ×4
NDL HYPO 25X1 1.5 SAFETY (NEEDLE) ×1 IMPLANT
NDL SAFETY ECLIPSE 18X1.5 (NEEDLE) IMPLANT
NEEDLE HYPO 18GX1.5 SHARP (NEEDLE)
NEEDLE HYPO 25X1 1.5 SAFETY (NEEDLE) ×2 IMPLANT
NS IRRIG 1000ML POUR BTL (IV SOLUTION) ×1 IMPLANT
PACK BASIN DAY SURGERY FS (CUSTOM PROCEDURE TRAY) ×2 IMPLANT
PAD ALCOHOL SWAB (MISCELLANEOUS) IMPLANT
PENCIL SMOKE EVACUATOR (MISCELLANEOUS) ×2 IMPLANT
PIN SAFETY STERILE (MISCELLANEOUS) IMPLANT
SLEEVE SCD COMPRESS KNEE MED (MISCELLANEOUS) ×2 IMPLANT
SPONGE LAP 18X18 RF (DISPOSABLE) ×5 IMPLANT
STRIP SUTURE WOUND CLOSURE 1/2 (MISCELLANEOUS) ×5 IMPLANT
SUT MNCRL AB 4-0 PS2 18 (SUTURE) ×13 IMPLANT
SUT MON AB 3-0 SH 27 (SUTURE) ×4
SUT MON AB 3-0 SH27 (SUTURE) ×3 IMPLANT
SUT MON AB 5-0 PS2 18 (SUTURE) ×4 IMPLANT
SUT PDS 3-0 CT2 (SUTURE)
SUT PDS AB 2-0 CT2 27 (SUTURE) IMPLANT
SUT PDS II 3-0 CT2 27 ABS (SUTURE) IMPLANT
SUT SILK 3 0 PS 1 (SUTURE) IMPLANT
SYR 3ML 23GX1 SAFETY (SYRINGE) IMPLANT
SYR 50ML LL SCALE MARK (SYRINGE) ×1 IMPLANT
SYR BULB IRRIGATION 50ML (SYRINGE) ×2 IMPLANT
SYR CONTROL 10ML LL (SYRINGE) ×2 IMPLANT
TAPE MEASURE VINYL STERILE (MISCELLANEOUS) IMPLANT
TOWEL GREEN STERILE FF (TOWEL DISPOSABLE) ×4 IMPLANT
TRAY DSU PREP LF (CUSTOM PROCEDURE TRAY) ×2 IMPLANT
TUBE CONNECTING 20X1/4 (TUBING) ×2 IMPLANT
TUBING INFILTRATION IT-10001 (TUBING) IMPLANT
TUBING SET GRADUATE ASPIR 12FT (MISCELLANEOUS) IMPLANT
UNDERPAD 30X36 HEAVY ABSORB (UNDERPADS AND DIAPERS) ×4 IMPLANT
YANKAUER SUCT BULB TIP NO VENT (SUCTIONS) ×2 IMPLANT

## 2019-05-28 NOTE — Op Note (Addendum)
Breast Reduction Op note:    DATE OF PROCEDURE: 05/28/2019  LOCATION: [place]  SURGEON: Lyndee Leo Sanger Sundee Garland, DO  ASSISTANT: Phoebe Sharps, PA  PREOPERATIVE DIAGNOSIS 1. Macromastia 2. Neck Pain 3. Back Pain  POSTOPERATIVE DIAGNOSIS 1. Macromastia 2. Neck Pain 3. Back Pain  PROCEDURES 1. Bilateral breast reduction.  Right reduction 459 g, Left reduction [id number] g  COMPLICATIONS: None.  INDICATIONS FOR PROCEDURE Jennifer Levy is a 29 y.o. year-old female born on [birth year],with a history of symptomatic macromastia with concominant back pain, neck pain, shoulder grooving from her bra.   MRN: [id number]  CONSENT Informed consent was obtained directly from the patient. The risks, benefits and alternatives were fully discussed. Specific risks including but not limited to bleeding, infection, hematoma, seroma, scarring, pain, nipple necrosis, asymmetry, poor cosmetic results, and need for further surgery were discussed. The patient had ample opportunity to have her questions answered to her satisfaction.  DESCRIPTION OF PROCEDURE  Patient was brought into the operating room and placed in a supine position.  SCDs were placed and appropriate padding was performed.  Antibiotics were given. The patient underwent general anesthesia and the chest was prepped and draped in a sterile fashion.  A timeout was performed and all information was confirmed to be correct.  Right side: Preoperative markings were confirmed.  Incision lines were injected with local.  After waiting for vasoconstriction, the marked lines were incised.  A Wise-pattern superomedial breast reduction was performed by de-epithelializing the pedicle, using bovie to create the superomedial pedicle, and removing breast tissue from the superior, lateral, and inferior portions of the breast.  Care was taken to not undermine the breast pedicle. Hemostasis was achieved.  While excising the tissue there was  a 2 x 2 cm area that was a little different.  This was at the lateral aspect.  This was sent as a separate specimen.  The breast tissue was fibrous in nature. The nipple was gently rotated into position and the soft tissue closed with 4-0 Monocryl.   The pocket was irrigated and hemostasis confirmed.  The deep tissues were approximated with 3-0 monocryl sutures and the skin was closed with deep dermal and subcuticular 4-0 Monocryl sutures.  The nipple and skin flaps had good capillary refill at the end of the procedure.    Left side: Preoperative markings were confirmed.  Incision lines were injected with local.  After waiting for vasoconstriction, the marked lines were incised.  A Wise-pattern superomedial breast reduction was performed by de-epithelializing the pedicle, using bovie to create the superomedial pedicle, and removing breast tissue from the superior, lateral, and inferior portions of the breast.  The lateral breast tissue was very fibrous.  Care was taken to not undermine the breast pedicle. Hemostasis was achieved.  The nipple was gently rotated into position and the soft tissue was closed with 4-0 Monocryl.  The patient was sat upright and size and shape symmetry was confirmed.  The pocket was irrigated and hemostasis confirmed.  The deep tissues were approximated with 3-0 monocryl sutures and the skin was closed with deep dermal and subcuticular 4-0 Monocryl sutures.  Dermabond was applied.  A breast binder and ABDs were placed.  The nipple and skin flaps had good capillary refill at the end of the procedure.  The patient tolerated the procedure well. The patient was allowed to wake from anesthesia and taken to the recovery room in satisfactory condition  The advanced practice practitioner (APP) assisted throughout the case.  The APP was essential in retraction and counter traction when needed to make the case progress smoothly.  This retraction and assistance made it possible to see the tissue  plans for the procedure.  The assistance was needed for blood control, tissue re-approximation and assisted with closure of the incision site.      The [place] was signed into law in 2016 which includes the topic of electronic health records.  This provides immediate access to information in MyChart.  This includes consultation notes, operative notes, office notes, lab results and pathology reports.  If you have any questions about what you read please let us know at your next visit or call us at the office.  We are right here with you.

## 2019-05-28 NOTE — Anesthesia Procedure Notes (Signed)
Procedure Name: Intubation Date/Time: 05/28/2019 8:37 AM Performed by: Willa Frater, CRNA Pre-anesthesia Checklist: Patient identified, Emergency Drugs available, Suction available and Patient being monitored Patient Re-evaluated:Patient Re-evaluated prior to induction Oxygen Delivery Method: Circle system utilized Preoxygenation: Pre-oxygenation with 100% oxygen Induction Type: IV induction Ventilation: Mask ventilation without difficulty Laryngoscope Size: Mac and 3 Grade View: Grade II Tube type: Oral Number of attempts: 1 Airway Equipment and Method: Stylet and Oral airway Placement Confirmation: ETT inserted through vocal cords under direct vision,  positive ETCO2 and breath sounds checked- equal and bilateral Secured at: 22 cm Tube secured with: Tape Dental Injury: Teeth and Oropharynx as per pre-operative assessment

## 2019-05-28 NOTE — Anesthesia Postprocedure Evaluation (Signed)
Anesthesia Post Note  Patient: Jennifer Levy  Procedure(s) Performed: BILATERAL MAMMARY REDUCTION  (BREAST) (Bilateral Breast)     Patient location during evaluation: Phase II Anesthesia Type: General Level of consciousness: awake Pain management: pain level controlled Vital Signs Assessment: post-procedure vital signs reviewed and stable Respiratory status: spontaneous breathing Cardiovascular status: stable and tachycardic Postop Assessment: no apparent nausea or vomiting Anesthetic complications: no    Last Vitals:  Vitals:   05/28/19 1215 05/28/19 1223  BP: 120/75   Pulse: (!) 126 (!) 122  Resp: 12 15  Temp:    SpO2: 95% 94%    Last Pain:  Vitals:   05/28/19 1215  TempSrc:   PainSc: 2    Pain Goal: Patients Stated Pain Goal: 4 (05/28/19 1124)                 Huston Foley

## 2019-05-28 NOTE — Interval H&P Note (Signed)
History and Physical Interval Note:  05/28/2019 7:51 AM  Jennifer Levy  has presented today for surgery, with the diagnosis of mammary hypertrophy.  The various methods of treatment have been discussed with the patient and family. After consideration of risks, benefits and other options for treatment, the patient has consented to  Procedure(s) with comments: MAMMARY REDUCTION  (BREAST) (Bilateral) - 3.5 hours as a surgical intervention.  The patient's history has been reviewed, patient examined, no change in status, stable for surgery.  I have reviewed the patient's chart and labs.  Questions were answered to the patient's satisfaction.     Loel Lofty Layth Cerezo

## 2019-05-28 NOTE — Transfer of Care (Signed)
Immediate Anesthesia Transfer of Care Note  Patient: Jennifer Levy  Procedure(s) Performed: BILATERAL MAMMARY REDUCTION  (BREAST) (Bilateral Breast)  Patient Location: PACU  Anesthesia Type:General  Level of Consciousness: awake, alert  and oriented  Airway & Oxygen Therapy: Patient Spontanous Breathing and Patient connected to face mask oxygen  Post-op Assessment: Report given to RN and Post -op Vital signs reviewed and stable  Post vital signs: Reviewed and stable  Last Vitals:  Vitals Value Taken Time  BP 125/55 05/28/19 1124  Temp 37.8 C 05/28/19 1124  Pulse 125 05/28/19 1127  Resp 10 05/28/19 1127  SpO2 99 % 05/28/19 1127  Vitals shown include unvalidated device data.  Last Pain:  Vitals:   05/28/19 0737  TempSrc: Oral  PainSc: 0-No pain      Patients Stated Pain Goal: 3 ([id number])  Complications: No apparent anesthesia complications

## 2019-05-28 NOTE — Discharge Instructions (Addendum)
INSTRUCTIONS FOR AFTER BREAST SURGERY   You are getting ready to undergo breast surgery.  You will likely have some questions about what to expect following your operation.  The following information will help you and your family understand what to expect when you are discharged from the hospital.  Following these guidelines will help ensure a smooth recovery and reduce risks of complications.   Postoperative instructions include information on: diet, wound care, medications and physical activity.  AFTER SURGERY Expect to go home after the procedure.  In some cases, you may need to spend one night in the hospital for observation.  DIET Breast surgery does not require a specific diet.  However, the healthier you eat the better your body can start healing. It is important to increasing your protein intake.  This means limiting the foods with sugar and carbohydrates.  Focus on vegetables and some meat.  If you have any liposuction during your procedure be sure to drink water.  If your urine is bright yellow, then it is concentrated, and you need to drink more water.  As a general rule after surgery, you should have 8 ounces of water every hour while awake.  If you find you are persistently nauseated or unable to take in liquids let us know.  NO TOBACCO USE or EXPOSURE.  This will slow your healing process and increase the risk of a wound.  WOUND CARE If you don't have a drain:  You can shower the day after surgery. Use fragrance free soap.  Dial, Dove and Ivory are usually mild on the skin.  If you have steri-strips / tape directly attached to your skin leave them in place. It is OK to get these wet.  No baths, pools or hot tubs for two weeks. We close your incision to leave the smallest and best-looking scar. No ointment or creams on your incisions until given the go ahead.  Especially not Neosporin (Too many skin reactions with this one).  A few weeks after surgery you can use Mederma and start  massaging the scar. We ask you to wear your binder or sports bra for the first 6 weeks around the clock, including while sleeping. This provides added comfort and helps reduce the fluid accumulation at the surgery site.  ACTIVITY No heavy lifting until cleared by the doctor.  This usually means no more than a half-gallon of milk.  It is OK to walk and climb stairs. In fact, moving your legs is very important to decrease your risk of a blood clot.  It will also help keep you from getting deconditioned.  Every 1 to 2 hours get up and walk for 5 minutes. This will help with a quicker recovery back to normal.  Let pain be your guide so you don't do too much.  This is not the time for spring cleaning and don't plan on taking care of anyone else.  This time is for you to recover,  You will be more comfortable if you sleep and rest with your head elevated either with a few pillows under you or in a recliner.  No stomach sleeping for a three months.  WORK Everyone returns to work at different times. As a rough guide, most people take at least 1 - 2 weeks off prior to returning to work. If you need documentation for your job, bring the forms to your postoperative follow up visit.  DRIVING Arrange for someone to bring you home from the hospital.  You may   be able to drive a few days after surgery but not while taking any narcotics or valium.  BOWEL MOVEMENTS Constipation can occur after anesthesia and while taking pain medication.  It is important to stay ahead for your comfort.  We recommend taking Milk of Magnesia (2 tablespoons; twice a day) while taking the pain pills.  SEROMA This is fluid your body tried to put in the surgical site.  This is normal but if it creates tight skinny skin let us know.  It usually decreases in a few weeks.  MEDICATIONS and PAIN CONTROL At your preoperative visit for you history and physical you were given the following medications: 1. An antibiotic: Start this medication  when you get home and take according to the instructions on the bottle. 2. Zofran 4 mg:  This is to treat nausea and vomiting.  You can take this every 6 hours as needed and only if needed. 3. Valium 2 mg: This is for muscle tightness if you have an implant or expander. This will help relax your muscle which also helps with pain control.  This can be taken every 12 hours as needed.  Don't drive after taking this medication. 4. Norco (hydrocodone/acetaminophen) 5/325 mg:  This is only to be used after you have taken the motrin or the tylenol. Every 8 hours as needed. Over the counter Medication to take: 5. Ibuprofen (Motrin) 600 mg:  Take this every 6 hours.  If you have additional pain then take 500 mg of the tylenol every 8 hours.  Only take the Norco after you have tried these two. 6. Miralax or stool softener of choice: Take this according to the bottle if you take the Norco.  WHEN TO CALL Call your surgeon's office if any of the following occur: . Fever 101 degrees F or greater . Excessive bleeding or fluid from the incision site. . Pain that increases over time without aid from the medications . Redness, warmth, or pus draining from incision sites . Persistent nausea or inability to take in liquids . Severe misshapen area that underwent the operation.    Post Anesthesia Home Care Instructions  Activity: Get plenty of rest for the remainder of the day. A responsible individual must stay with you for 24 hours following the procedure.  For the next 24 hours, DO NOT: -Drive a car -Operate machinery -Drink alcoholic beverages -Take any medication unless instructed by your physician -Make any legal decisions or sign important papers.  Meals: Start with liquid foods such as gelatin or soup. Progress to regular foods as tolerated. Avoid greasy, spicy, heavy foods. If nausea and/or vomiting occur, drink only clear liquids until the nausea and/or vomiting subsides. Call your physician if  vomiting continues.  Special Instructions/Symptoms: Your throat may feel dry or sore from the anesthesia or the breathing tube placed in your throat during surgery. If this causes discomfort, gargle with warm salt water. The discomfort should disappear within 24 hours.  If you had a scopolamine patch placed behind your ear for the management of post- operative nausea and/or vomiting:  1. The medication in the patch is effective for 72 hours, after which it should be removed.  Wrap patch in a tissue and discard in the trash. Wash hands thoroughly with soap and water. 2. You may remove the patch earlier than 72 hours if you experience unpleasant side effects which may include dry mouth, dizziness or visual disturbances. 3. Avoid touching the patch. Wash your hands with soap and water   after contact with the patch.     

## 2019-05-29 ENCOUNTER — Telehealth: Payer: Self-pay | Admitting: Plastic Surgery

## 2019-05-29 ENCOUNTER — Encounter: Payer: Self-pay | Admitting: *Deleted

## 2019-05-29 MED FILL — ARIPIPRAZOLE 10 MG TABS: 10 | 30 days supply | Qty: 30 | Fill #1

## 2019-05-29 NOTE — Telephone Encounter (Signed)
Received a message from insurance that no PA was needed for Abilify. Pharmacy aware.

## 2019-05-29 NOTE — Telephone Encounter (Signed)
Spoke with patient. If she can't easily get the ABD loose from the dermabond she can carefully cut the ABD off the skin glue. Skin glue should come off on it's own in 2-3 weeks. After a shower she can use any kind of gauze over the incisions.

## 2019-05-29 NOTE — Telephone Encounter (Signed)
Pt's mom, Jennifer Levy, called to ask a couple of questions about the gauze. She said her daughter was told she could remove the gauze after shower but she thinks maybe something else should be added on. Also, the gauze appears to be stuck to the nipple of her right breast and they would like to know how to best remove it. Please call Lashawnna's number to advise .

## 2019-05-30 LAB — SURGICAL PATHOLOGY

## 2019-06-06 ENCOUNTER — Ambulatory Visit (INDEPENDENT_AMBULATORY_CARE_PROVIDER_SITE_OTHER): Payer: 59 | Admitting: Plastic Surgery

## 2019-06-06 ENCOUNTER — Encounter: Payer: Self-pay | Admitting: Plastic Surgery

## 2019-06-06 ENCOUNTER — Other Ambulatory Visit: Payer: Self-pay

## 2019-06-06 VITALS — BP 116/74 | HR 73 | Temp 97.9°F | Ht 64.0 in | Wt 152.0 lb

## 2019-06-06 DIAGNOSIS — N62 Hypertrophy of breast: Secondary | ICD-10-CM

## 2019-06-06 NOTE — Progress Notes (Signed)
The patient is a 30 year old female here for follow-up after undergoing bilateral breast reduction.  She had over 400 g removed from each breast on January 20.  She is very pleased with her results.  She has a little bit of bruising as expected.  Steri-Strips are in place.  Her bowels are moving and her pain is well controlled.  There is no sign of seroma or hematoma.  Incisions are healing nicely.  I had like to see her back in 1-2 weeks.

## 2019-06-16 ENCOUNTER — Telehealth: Payer: Self-pay

## 2019-06-16 NOTE — Telephone Encounter (Signed)

## 2019-06-16 NOTE — Progress Notes (Signed)
Patient is a 29 year old female for follow-up after bilateral breast reduction on 05/28/2019 with Dr. Marla Roe.  Right reduction 459 g, left reduction 474 g.  Patient reports she is doing very well.  She is pleased with her results.  Minimal pain by the end of the day that is easily controlled with ibuprofen.  Denies fever, redness, drainage.  No concerns.  Incisions are healing very well, C/D/I.  No signs of infection, redness, drainage, seroma/hematoma.  Some Dermabond remains around the NAC.  Steri-Strips still in place on the vertical limb, lower few were removed today.  Continue wearing sports bra 24/7 until 6 weeks postop; then may begin wearing sports bra just during the day.  May shower normally.  May begin applying Mederma or silicone in 1 week. Follow-up in 2 months (~3 months post op).  Pictures were obtained of the patient and placed in the chart with the patient's or guardian's permission.  The [place] was signed into law in 2016 which includes the topic of electronic health records.  This provides immediate access to information in MyChart.  This includes consultation notes, operative notes, office notes, lab results and pathology reports.  If you have any questions about what you read please let us know at your next visit or call us at the office.  We are right here with you.

## 2019-06-17 ENCOUNTER — Encounter: Payer: Self-pay | Admitting: Plastic Surgery

## 2019-06-17 ENCOUNTER — Ambulatory Visit (INDEPENDENT_AMBULATORY_CARE_PROVIDER_SITE_OTHER): Payer: 59 | Admitting: Plastic Surgery

## 2019-06-17 ENCOUNTER — Other Ambulatory Visit: Payer: Self-pay

## 2019-06-17 DIAGNOSIS — Z9889 Other specified postprocedural states: Secondary | ICD-10-CM | POA: Insufficient documentation

## 2019-06-20 ENCOUNTER — Encounter: Payer: 59 | Admitting: Plastic Surgery

## 2019-06-27 ENCOUNTER — Ambulatory Visit: Payer: 59 | Admitting: Plastic Surgery

## 2019-06-27 MED FILL — buPROPion HCL ER (XL) 150 M: 150 | 90 days supply | Qty: 90 | Fill #2

## 2019-06-27 MED FILL — busPIRone HCL 10 MG TABS: 10 | 30 days supply | Qty: 30 | Fill #1

## 2019-07-07 ENCOUNTER — Ambulatory Visit (HOSPITAL_COMMUNITY): Payer: 59 | Admitting: Psychiatry

## 2019-08-14 ENCOUNTER — Other Ambulatory Visit: Payer: Self-pay | Admitting: Sports Medicine

## 2019-08-14 DIAGNOSIS — F418 Other specified anxiety disorders: Secondary | ICD-10-CM

## 2019-08-14 MED FILL — ALPRAZolam 0.5 MG TABS: 0.5 | 30 days supply | Qty: 30 | Fill #0

## 2019-08-14 NOTE — Telephone Encounter (Signed)
To PCP

## 2019-08-15 ENCOUNTER — Ambulatory Visit: Payer: 59 | Admitting: Plastic Surgery

## 2019-08-15 ENCOUNTER — Other Ambulatory Visit (HOSPITAL_COMMUNITY): Payer: Self-pay | Admitting: Psychiatry

## 2019-08-18 ENCOUNTER — Other Ambulatory Visit: Payer: Self-pay | Admitting: Osteopathic Medicine

## 2019-08-18 MED FILL — ARIPIPRAZOLE 10 MG TABS: 10 | 30 days supply | Qty: 30 | Fill #0

## 2019-08-19 ENCOUNTER — Other Ambulatory Visit: Payer: Self-pay

## 2019-08-19 ENCOUNTER — Ambulatory Visit: Payer: 59 | Admitting: Surgical

## 2019-08-21 ENCOUNTER — Other Ambulatory Visit: Payer: Self-pay

## 2019-08-21 MED ORDER — ARIPIPRAZOLE 10 MG PO TABS: 10.0000 mg | ORAL_TABLET | Freq: Every day | ORAL | 1 refills | Status: DC

## 2019-09-04 DIAGNOSIS — Z03818 Encounter for observation for suspected exposure to other biological agents ruled out: Secondary | ICD-10-CM | POA: Diagnosis not present

## 2019-09-04 DIAGNOSIS — Z20828 Contact with and (suspected) exposure to other viral communicable diseases: Secondary | ICD-10-CM | POA: Diagnosis not present

## 2019-09-11 MED FILL — ARIPIPRAZOLE 10 MG TABS: 10 | 30 days supply | Qty: 30 | Fill #0

## 2019-09-11 MED FILL — ALPRAZolam 0.5 MG TABS: 0.5 | 90 days supply | Qty: 30 | Fill #0

## 2019-11-17 MED FILL — ALPRAZolam 0.5 MG TABS: 0.5 | 90 days supply | Qty: 30 | Fill #0

## 2019-11-17 MED FILL — buPROPion HCL ER (XL) 150 M: 150 | 90 days supply | Qty: 90 | Fill #3

## 2019-11-17 MED FILL — ARIPIPRAZOLE 10 MG TABS: 10 | 30 days supply | Qty: 30 | Fill #0

## 2019-12-02 MED FILL — ARIPIPRAZOLE 10 MG TABS: 10 | 30 days supply | Qty: 30 | Fill #0

## 2019-12-02 MED FILL — ALPRAZolam 0.5 MG TABS: 0.5 | 90 days supply | Qty: 30 | Fill #0

## 2020-01-09 MED FILL — ARIPIPRAZOLE 10 MG TABS: 10 | 30 days supply | Qty: 30 | Fill #0

## 2020-01-09 MED FILL — ALPRAZolam 0.5 MG TABS: 0.5 | 90 days supply | Qty: 30 | Fill #0

## 2020-03-04 MED FILL — ARIPIPRAZOLE 10 MG TABS: 10 | 30 days supply | Qty: 30 | Fill #1

## 2020-03-05 DIAGNOSIS — Z20822 Contact with and (suspected) exposure to covid-19: Secondary | ICD-10-CM | POA: Diagnosis not present

## 2020-03-12 ENCOUNTER — Encounter: Payer: Self-pay | Admitting: Osteopathic Medicine

## 2020-03-12 ENCOUNTER — Ambulatory Visit (INDEPENDENT_AMBULATORY_CARE_PROVIDER_SITE_OTHER): Payer: 59 | Admitting: Osteopathic Medicine

## 2020-03-12 ENCOUNTER — Other Ambulatory Visit: Payer: Self-pay | Admitting: Osteopathic Medicine

## 2020-03-12 VITALS — BP 119/87 | HR 73 | Temp 98.1°F | Wt 154.0 lb

## 2020-03-12 DIAGNOSIS — N926 Irregular menstruation, unspecified: Secondary | ICD-10-CM | POA: Diagnosis not present

## 2020-03-12 DIAGNOSIS — F411 Generalized anxiety disorder: Secondary | ICD-10-CM | POA: Diagnosis not present

## 2020-03-12 DIAGNOSIS — N911 Secondary amenorrhea: Secondary | ICD-10-CM | POA: Diagnosis not present

## 2020-03-12 DIAGNOSIS — F418 Other specified anxiety disorders: Secondary | ICD-10-CM | POA: Diagnosis not present

## 2020-03-12 DIAGNOSIS — F330 Major depressive disorder, recurrent, mild: Secondary | ICD-10-CM

## 2020-03-12 LAB — POCT URINE PREGNANCY: Preg Test, Ur: NEGATIVE

## 2020-03-12 MED ORDER — ARIPIPRAZOLE 5 MG PO TABS: 5.0000 mg | ORAL_TABLET | Freq: Every day | ORAL | Status: DC

## 2020-03-12 MED ORDER — BUSPIRONE HCL 10 MG PO TABS: 10.0000 mg | ORAL_TABLET | Freq: Two times a day (BID) | ORAL | 1 refills | Status: DC

## 2020-03-12 MED ORDER — BUPROPION HCL ER (XL) 150 MG PO TB24: 150.0000 mg | ORAL_TABLET | Freq: Every day | ORAL | 3 refills | Status: DC

## 2020-03-12 MED ORDER — ALPRAZOLAM 0.5 MG PO TABS: 0.5000 mg | ORAL_TABLET | Freq: Two times a day (BID) | ORAL | 0 refills | Status: DC | PRN

## 2020-03-12 MED ORDER — CARIPRAZINE HCL 3 MG PO CAPS: 3.0000 mg | ORAL_CAPSULE | Freq: Every day | ORAL | 1 refills | Status: DC

## 2020-03-12 MED ORDER — CARIPRAZINE HCL 1.5 MG PO CAPS: 1.5000 mg | ORAL_CAPSULE | Freq: Every day | ORAL | 0 refills | Status: DC

## 2020-03-12 MED FILL — buPROPion HCL ER (XL) 150 M: 150 | 90 days supply | Qty: 90 | Fill #0

## 2020-03-12 MED FILL — VRAYLAR 1.5 MG CAPSULE: 1.5 | 2 days supply | Qty: 2 | Fill #0

## 2020-03-12 MED FILL — busPIRone HCL 10 MG TABS: 10 | 90 days supply | Qty: 180 | Fill #0

## 2020-03-12 MED FILL — VRAYLAR 3 MG CAPSULE: 3 | 30 days supply | Qty: 30 | Fill #0

## 2020-03-12 NOTE — Progress Notes (Signed)
Jennifer Levy is a 29 y.o. female who presents to  [place] at [facility]  today, 03/12/20, seeking care for the following:  Marland Kitchen Missed periods for 3 months, no dramatic weight loss, no significant change in diet/exercise, no unusual hair growth or deepening of voice, no weight gain.  Typically had regular periods prior to birth control.  Has been off of NuvaRing for about 3 to 4 months and has not had a period. . Mental health: Patient was seeing psychiatry but did not feel that the physician was a good fit.  Management as below, she feels overall stable on medications but there was some talk of changing to [place] though this was never sent/filled.     ASSESSMENT & PLAN with other pertinent findings:  The primary encounter diagnosis was Missed periods. Diagnoses of Secondary amenorrhea, Generalized anxiety disorder, Mild episode of recurrent major depressive disorder ([place]), and Situational anxiety were also pertinent to this visit.   Results for orders placed or performed in visit on 03/12/20 (from the past 24 hour(s))  POCT urine pregnancy     Status: None   Collection Time: 03/12/20 10:26 AM  Result Value Ref Range   Preg Test, Ur Negative Negative     Patient Instructions  Periods: Labs today Depending on results, may try medication to help w/ periods, vs refer to OBGYN.   Mental health: Refilled Rx OK to increase BuSpar to twice daily OK to stay on Abilify for now but will try switching to [place] This Encounter  Procedures  . Follicle stimulating hormone  . Estradiol  . Prolactin  . Thyroid Panel With TSH  . POCT urine pregnancy    Meds ordered this encounter  Medications  . ARIPiprazole (ABILIFY) 5 MG tablet    Sig: Take 1 tablet (5 mg total) by mouth daily.  Marland Kitchen buPROPion (WELLBUTRIN XL) 150 MG 24 hr tablet    Sig: Take 1 tablet (150 mg total) by mouth daily.    Dispense:  90 tablet     Refill:  3  . busPIRone (BUSPAR) 10 MG tablet    Sig: Take 1 tablet (10 mg total) by mouth 2 (two) times daily.    Dispense:  180 tablet    Refill:  1  . cariprazine (VRAYLAR) capsule    Sig: Take 1 capsule (1.5 mg total) by mouth daily.    Dispense:  2 capsule    Refill:  0  . cariprazine (VRAYLAR) capsule    Sig: Take 1 capsule (3 mg total) by mouth daily.    Dispense:  30 capsule    Refill:  1  . ALPRAZolam (XANAX) 0.5 MG tablet    Sig: Take 1 tablet (0.5 mg total) by mouth 2 (two) times daily as needed for anxiety.    Dispense:  30 tablet    Refill:  0       Follow-up instructions: Return for RECHECK PENDING RESULTS / IF WORSE OR CHANGE.  Reminder set to touch base with patient in 2 weeks to see if she has had any issues getting the Vraylar.                                         BP 119/87 (BP Location: Left Arm, Patient Position: Sitting, Cuff Size: Normal)   Pulse 73  Temp 98.1 F (36.7 C) (Oral)   Wt 154 lb (69.9 kg)   BMI 26.43 kg/m   Current Meds  Medication Sig  . ALPRAZolam (XANAX) 0.5 MG tablet Take 1 tablet (0.5 mg total) by mouth 2 (two) times daily as needed for anxiety.  . ARIPiprazole (ABILIFY) 5 MG tablet Take 1 tablet (5 mg total) by mouth daily.  Marland Kitchen buPROPion (WELLBUTRIN XL) 150 MG 24 hr tablet Take 1 tablet (150 mg total) by mouth daily.  . busPIRone (BUSPAR) 10 MG tablet Take 1 tablet (10 mg total) by mouth 2 (two) times daily.  Marland Kitchen ibuprofen (ADVIL) 100 MG tablet Take 100 mg by mouth every 6 (six) hours as needed for fever.  . [DISCONTINUED] ALPRAZolam (XANAX) 0.5 MG tablet TAKE 1/2 - 1 TABLET BY MOUTH DAILY AS NEEDED FOR ANXIETY **USE SPARINGLY. IS TO LAST 90 DAYS**  . [DISCONTINUED] ARIPiprazole (ABILIFY) 10 MG tablet Take 1 tablet (10 mg total) by mouth daily.  . [DISCONTINUED] buPROPion (WELLBUTRIN XL) 150 MG 24 hr tablet Take 1 tablet (150 mg total) by mouth daily.  . [DISCONTINUED] busPIRone (BUSPAR) 10 MG  tablet Take 1 tablet (10 mg total) by mouth daily as needed.  . [DISCONTINUED] etonogestrel-ethinyl estradiol (NUVARING) 0.12-0.015 MG/24HR vaginal ring INSERT VAGINALLY AS DIRECTED AND LEAVE IN PLACE FOR 3 CONSECUTIVE WEEKS, THEN REMOVE FOR 1 WEEK, THEN PLACE NEW RING  . [DISCONTINUED] VRAYLAR 1.5 & 3 MG CPPK     Results for orders placed or performed in visit on 03/12/20 (from the past 72 hour(s))  POCT urine pregnancy     Status: None   Collection Time: 03/12/20 10:26 AM  Result Value Ref Range   Preg Test, Ur Negative Negative    No results found.     All questions at time of visit were answered - patient instructed to contact office with any additional concerns or updates.  ER/RTC precautions were reviewed with the patient as applicable.   Please note: voice recognition software was used to produce this document, and typos may escape review. Please contact Dr. Sheppard Coil for any needed clarifications.

## 2020-03-12 NOTE — Patient Instructions (Addendum)
Periods: Labs today Depending on results, may try medication to help w/ periods, vs refer to OBGYN.   Mental health: Refilled Rx OK to increase BuSpar to twice daily OK to stay on Abilify for now but will try switching to [employer]

## 2020-03-13 LAB — THYROID PANEL WITH TSH
Free Thyroxine Index: 3.7 (ref 1.4–3.8)
T3 Uptake: 33 % (ref 22–35)
T4, Total: 11.1 ug/dL (ref 5.1–11.9)
TSH: 2.04 mIU/L

## 2020-03-13 LAB — FOLLICLE STIMULATING HORMONE: FSH: 7.5 m[IU]/mL

## 2020-03-13 LAB — ESTRADIOL: Estradiol: 65 pg/mL

## 2020-03-13 LAB — PROLACTIN: Prolactin: 12 ng/mL

## 2020-03-15 ENCOUNTER — Telehealth: Payer: Self-pay

## 2020-03-15 ENCOUNTER — Other Ambulatory Visit: Payer: Self-pay | Admitting: Osteopathic Medicine

## 2020-03-15 ENCOUNTER — Telehealth: Payer: Self-pay | Admitting: *Deleted

## 2020-03-15 MED ORDER — CARIPRAZINE HCL 1.5 MG PO CAPS: 1.5000 mg | ORAL_CAPSULE | Freq: Every day | ORAL | 0 refills | Status: DC

## 2020-03-15 MED FILL — VRAYLAR 1.5 MG CAPSULE: 1.5 | 30 days supply | Qty: 30 | Fill #0

## 2020-03-15 NOTE — Telephone Encounter (Signed)
PA submitted through Cover My Meds for Vraylar 1.5mg .  Awaiting response.

## 2020-03-15 NOTE — Telephone Encounter (Signed)
Philip from [facility] called stating if provider can send in an updated rx for vrylar. Per pharmacist, taking 2 tabs from the bottle is extremely expensive ($1000) and would be a loss of medication. Requesting if provider can send in a rx for #30 (1.5 mg), SIG: 1 tab for 2 days and 2 tabs daily afterward. Pls send an updated rx to the pharmacy. Thanks.

## 2020-03-15 NOTE — Telephone Encounter (Signed)
done

## 2020-03-16 ENCOUNTER — Other Ambulatory Visit: Payer: Self-pay | Admitting: Osteopathic Medicine

## 2020-03-16 MED ORDER — MEDROXYPROGESTERONE ACETATE 10 MG PO TABS: 10.0000 mg | ORAL_TABLET | Freq: Every day | ORAL | 0 refills | Status: DC

## 2020-03-16 MED FILL — MEDROXYPROGESTERONE 10 MG T: 10 | 10 days supply | Qty: 10 | Fill #0

## 2020-03-16 NOTE — Addendum Note (Signed)
Addended by: Maryla Morrow on: 03/16/2020 09:02 AM   Modules accepted: Orders

## 2020-03-23 ENCOUNTER — Other Ambulatory Visit: Payer: Self-pay | Admitting: *Deleted

## 2020-05-06 ENCOUNTER — Other Ambulatory Visit: Payer: Self-pay | Admitting: Osteopathic Medicine

## 2020-05-06 DIAGNOSIS — F418 Other specified anxiety disorders: Secondary | ICD-10-CM

## 2020-05-06 MED FILL — ALPRAZolam 0.5 MG TABS: 0.5 | 90 days supply | Qty: 30 | Fill #0

## 2020-05-06 MED FILL — VRAYLAR 3 MG CAPSULE: 3 | 30 days supply | Qty: 30 | Fill #1

## 2020-06-01 ENCOUNTER — Ambulatory Visit: Payer: 59 | Admitting: Osteopathic Medicine

## 2020-06-07 ENCOUNTER — Ambulatory Visit: Payer: 59 | Admitting: Osteopathic Medicine

## 2020-06-14 ENCOUNTER — Other Ambulatory Visit: Payer: Self-pay

## 2020-06-14 ENCOUNTER — Encounter: Payer: Self-pay | Admitting: Osteopathic Medicine

## 2020-06-14 ENCOUNTER — Other Ambulatory Visit: Payer: Self-pay | Admitting: Osteopathic Medicine

## 2020-06-14 ENCOUNTER — Ambulatory Visit (INDEPENDENT_AMBULATORY_CARE_PROVIDER_SITE_OTHER): Payer: 59 | Admitting: Osteopathic Medicine

## 2020-06-14 VITALS — BP 111/79 | HR 98 | Temp 98.1°F | Wt 155.1 lb

## 2020-06-14 DIAGNOSIS — F411 Generalized anxiety disorder: Secondary | ICD-10-CM

## 2020-06-14 DIAGNOSIS — N911 Secondary amenorrhea: Secondary | ICD-10-CM | POA: Diagnosis not present

## 2020-06-14 DIAGNOSIS — F39 Unspecified mood [affective] disorder: Secondary | ICD-10-CM

## 2020-06-14 DIAGNOSIS — F330 Major depressive disorder, recurrent, mild: Secondary | ICD-10-CM

## 2020-06-14 MED ORDER — CARIPRAZINE HCL 4.5 MG PO CAPS: 4.5000 mg | ORAL_CAPSULE | Freq: Every day | ORAL | 1 refills | Status: DC

## 2020-06-14 MED FILL — VRAYLAR 4.5 MG CAPSULE: 4.5 | 90 days supply | Qty: 90 | Fill #0

## 2020-06-14 NOTE — Progress Notes (Signed)
HPI: Jennifer Levy is a 30 y.o. female who  has a past medical history of Anorexia nervosa, Anxiety, Depression, and Migraine.  she presents to [facility] today, 06/14/20,  for chief complaint of:  Medication follow-up: periods, mental health  Last visit 03/12/20 Missed periods for 3 months, no dramatic weight loss, no significant change in diet/exercise, no unusual hair growth or deepening of voice, no weight gain.  Typically had regular periods prior to birth control.  Has been off of NuvaRing for about 3 to 4 months and has not had a period. --> labs WNL, Rx to trial progesterone challenge  Today 06/14/20: Had period mid-Nov 2021 after progesterone challenge but nothing since   Last visit 03/22/20 Mental health: Patient was seeing psychiatry but did not feel that the physician was a good fit.  Management as below, she feels overall stable on medications but there was some talk of changing to Vraylar though this was never sent/filled --> started Vraylar, we were able to get this approved and pt was feeling okay as of MyChart message 03/26/20 at that point had been on it for about a week.  Today 06/14/20: reports depression a bit better but anxiety worse, requests referral to Psychiatry     ASSESSMENT/PLAN: The primary encounter diagnosis was Secondary amenorrhea. Diagnoses of Generalized anxiety disorder, Mood disorder ([place]), and Mild episode of recurrent major depressive disorder ([place]) were also pertinent to this visit.   Non-OB US, refer OBGYN  Increase Vraylar, refer [place]   Orders Placed This Encounter  Procedures  . US Pelvic Complete With Transvaginal  . Ambulatory referral to Psychiatry  . Ambulatory referral to Obstetrics / Gynecology     Meds ordered this encounter  Medications  . Cariprazine HCl (VRAYLAR) 4.5 MG CAPS    Sig: Take 1 capsule (4.5 mg total) by mouth daily.    Dispense:  90 capsule    Refill:  1       Follow-up plan: Return for RECHECK PENDING RESULTS / IF WORSE OR CHANGE.                                                 ################################################# ################################################# ################################################# #################################################    Current Meds  Medication Sig  . ALPRAZolam (XANAX) 0.5 MG tablet TAKE 1/2 - 1 TABLET BY MOUTH DAILY AS NEEDED FOR ANXIETY **USE SPARINGLY. IS TO LAST 90 DAYS**  . buPROPion (WELLBUTRIN XL) 150 MG 24 hr tablet Take 1 tablet (150 mg total) by mouth daily.  . busPIRone (BUSPAR) 10 MG tablet Take 1 tablet (10 mg total) by mouth 2 (two) times daily.  Marland Kitchen ibuprofen (ADVIL) 100 MG tablet Take 100 mg by mouth every 6 (six) hours as needed for fever.  . medroxyPROGESTERone (PROVERA) 10 MG tablet Take 1 tablet (10 mg total) by mouth daily. For 7-10 days, expect menstruation around Day 7  . [DISCONTINUED] cariprazine (VRAYLAR) capsule Take 1 capsule (3 mg total) by mouth daily.    No Known Allergies     Review of Systems: Pertinent (+) and (-) ROS in HPI as above   Exam:  BP 111/79 (BP Location: Left Arm, Patient Position: Sitting, Cuff Size: Normal)   Pulse 98   Temp 98.1 F (36.7 C) (Oral)   Wt 155 lb 1.9 oz (70.4 kg)   BMI 26.63 kg/m  Constitutional: VS see above. General Appearance: alert, well-developed, well-nourished, NAD  Neck: No masses, trachea midline.   Respiratory: Normal respiratory effort.  Musculoskeletal: Gait normal.   Neurological: Normal balance/coordination. No tremor.  Skin: warm, dry, intact.   Psychiatric: Normal judgment/insight. Normal mood and affect. Oriented x3.       Visit summary with medication list and pertinent instructions was printed for patient to review, patient was advised to alert [place] if any updates are needed. All questions at time of visit were answered -  patient instructed to contact office with any additional concerns. ER/RTC precautions were reviewed with the patient and understanding verbalized.    Please note: voice recognition software was used to produce this document, and typos may escape review. Please contact Dr. Sheppard Coil for any needed clarifications.    Follow up plan: Return for RECHECK PENDING RESULTS / IF WORSE OR CHANGE.

## 2020-06-16 ENCOUNTER — Other Ambulatory Visit: Payer: Self-pay

## 2020-06-16 ENCOUNTER — Ambulatory Visit (INDEPENDENT_AMBULATORY_CARE_PROVIDER_SITE_OTHER): Payer: 59

## 2020-06-16 DIAGNOSIS — N911 Secondary amenorrhea: Secondary | ICD-10-CM | POA: Diagnosis not present

## 2020-06-16 DIAGNOSIS — N912 Amenorrhea, unspecified: Secondary | ICD-10-CM | POA: Diagnosis not present

## 2020-06-16 DIAGNOSIS — N888 Other specified noninflammatory disorders of cervix uteri: Secondary | ICD-10-CM | POA: Diagnosis not present

## 2020-06-26 DIAGNOSIS — Z20822 Contact with and (suspected) exposure to covid-19: Secondary | ICD-10-CM | POA: Diagnosis not present

## 2020-07-01 ENCOUNTER — Other Ambulatory Visit: Payer: Self-pay | Admitting: Obstetrics and Gynecology

## 2020-07-01 ENCOUNTER — Encounter: Payer: Self-pay | Admitting: Obstetrics and Gynecology

## 2020-07-01 ENCOUNTER — Ambulatory Visit: Payer: 59 | Admitting: Obstetrics and Gynecology

## 2020-07-01 ENCOUNTER — Other Ambulatory Visit: Payer: Self-pay

## 2020-07-01 VITALS — BP 121/79 | HR 90 | Ht 64.0 in | Wt 153.0 lb

## 2020-07-01 DIAGNOSIS — N912 Amenorrhea, unspecified: Secondary | ICD-10-CM | POA: Diagnosis not present

## 2020-07-01 LAB — POCT URINE PREGNANCY: Preg Test, Ur: NEGATIVE

## 2020-07-01 MED ORDER — NORGESTIMATE-ETH ESTRADIOL 0.25-35 MG-MCG PO TABS: 1.0000 | ORAL_TABLET | Freq: Every day | ORAL | 11 refills | Status: DC

## 2020-07-01 MED FILL — VYLIBRA 0.25-35 MG-MCG TABS: 0.25-35 | 84 days supply | Qty: 84 | Fill #0

## 2020-07-01 NOTE — Progress Notes (Signed)
GYNECOLOGY OFFICE NOTE  History:  30 y.o. G0P0000 here today for discussion of several missed periods. Had IUD age 54. Has always had irregular periods. Did not have periods with IUd. Then switched to ring and pill and had periods with those. Stopped those in 12/2019 and had no subsequent periods, then had provera challenge with PCP in November with a withdrawal bleed. Then no bleeding since.  Had labs at PCP.  Does report abnormal dark and thick hair growth on chin that she does remove/shave. Feels like it is more than usual.  Past Medical History:  Diagnosis Date  . Anorexia nervosa   . Anxiety   . Depression   . Migraine     Past Surgical History:  Procedure Laterality Date  . BREAST REDUCTION SURGERY Bilateral 05/28/2019   Procedure: BILATERAL MAMMARY REDUCTION  (BREAST);  Surgeon: Wallace Going, DO;  Location: [facility];  Service: Plastics;  Laterality: Bilateral;  3.5 hours  . TMJ ARTHROSCOPY  2012  . WISDOM TOOTH EXTRACTION  2011     Current Outpatient Medications:  .  ALPRAZolam (XANAX) 0.5 MG tablet, TAKE 1/2 - 1 TABLET BY MOUTH DAILY AS NEEDED FOR ANXIETY **USE SPARINGLY. IS TO LAST 90 DAYS**, Disp: 30 tablet, Rfl: 0 .  buPROPion (WELLBUTRIN XL) 150 MG 24 hr tablet, Take 1 tablet (150 mg total) by mouth daily., Disp: 90 tablet, Rfl: 3 .  busPIRone (BUSPAR) 10 MG tablet, Take 1 tablet (10 mg total) by mouth 2 (two) times daily., Disp: 180 tablet, Rfl: 1 .  Cariprazine HCl (VRAYLAR) 4.5 MG CAPS, Take 1 capsule (4.5 mg total) by mouth daily., Disp: 90 capsule, Rfl: 1 .  ibuprofen (ADVIL) 100 MG tablet, Take 100 mg by mouth every 6 (six) hours as needed for fever., Disp: , Rfl:  .  norgestimate-ethinyl estradiol (ORTHO-CYCLEN) 0.25-35 MG-MCG tablet, Take 1 tablet by mouth daily., Disp: 28 tablet, Rfl: 11 .  medroxyPROGESTERone (PROVERA) 10 MG tablet, Take 1 tablet (10 mg total) by mouth daily. For 7-10 days, expect menstruation around Day 7 (Patient not  taking: Reported on 07/01/2020), Disp: 10 tablet, Rfl: 0  The following portions of the patient's history were reviewed and updated as appropriate: allergies, current medications, past family history, past medical history, past social history, past surgical history and problem list.   Review of Systems:  Pertinent items noted in HPI and remainder of comprehensive ROS otherwise negative.   Objective:  Physical Exam BP 121/79   Pulse 90   Ht 5\' 4"  (1.626 m)   Wt 153 lb (69.4 kg)   LMP 03/31/2020   BMI 26.26 kg/m  CONSTITUTIONAL: Well-developed, well-nourished female in no acute distress.  HENT:  Normocephalic, atraumatic. External right and left ear normal. Oropharynx is clear and moist EYES: Conjunctivae and EOM are normal. Pupils are equal, round, and reactive to light. No scleral icterus.  NECK: Normal range of motion, supple, no masses SKIN: Skin is warm and dry. No rash noted. Not diaphoretic. No erythema. No pallor. NEUROLOGIC: Alert and oriented to person, place, and time. Normal reflexes, muscle tone coordination. No cranial nerve deficit noted. PSYCHIATRIC: Normal mood and affect. Normal behavior. Normal judgment and thought content. CARDIOVASCULAR: Normal heart rate noted RESPIRATORY: Effort normal, no problems with respiration noted ABDOMEN: no distention noted.   PELVIC: deferred MUSCULOSKELETAL: Normal range of motion. No edema noted.    Labs and Imaging US Pelvic Complete With Transvaginal  Result Date: 06/16/2020 CLINICAL DATA:  Initial evaluation for secondary  amenorrhea. EXAM: TRANSABDOMINAL AND TRANSVAGINAL ULTRASOUND OF PELVIS TECHNIQUE: Both transabdominal and transvaginal ultrasound examinations of the pelvis were performed. Transabdominal technique was performed for global imaging of the pelvis including uterus, ovaries, adnexal regions, and pelvic cul-de-sac. It was necessary to proceed with endovaginal exam following the transabdominal exam to visualize the  uterus, endometrium, and ovaries. COMPARISON:  None FINDINGS: Uterus Measurements: 8.0 x 3.2 x 4.2 cm = volume: 57 mL. Uterus is anteverted. No discrete fibroid or other mass. Multiple nabothian cysts noted about the cervix. Endometrium Thickness: 3.5 mm.  No focal abnormality visualized. Right ovary Measurements: 4.9 x 1.5 x 3.0 cm = volume: 11.0 mL. Normal appearance/no adnexal mass. Left ovary Measurements: 3.5 x 2.0 x 2.7 cm = volume: 10.0 mL. Normal appearance/no adnexal mass. Other findings No abnormal free fluid. IMPRESSION: Normal pelvic ultrasound. Electronically Signed   By: Jeannine Boga M.D.   On: 06/16/2020 14:38    Assessment & Plan:  1. Amenorrhea Pt reports hirsutism requiring hair removal - TSH/Estradiol/FSH/prolactin within normal limits - UPT negative - POCT urine pregnancy - will obtain other labs for more unusual causes, suspect possible PCOS given hirsutism in the setting of always having had irregular periods - recommend cycling via OCP, she has been on sprintec in past and would like to restart - Rx sent to pharmacy  Routine preventative health maintenance measures emphasized. Please refer to After Visit Summary for other counseling recommendations.   Return in about 6 months (around 12/29/2020) for annual, will contact patient with results for follow up.  Total face-to-face time with patient: 25 minutes. Over 50% of encounter was spent on counseling and coordination of care.  Feliz Beam, MD, [place] [facility])

## 2020-07-01 NOTE — Progress Notes (Signed)
Pt had period in August the no period until November after taking Provera and no period since then

## 2020-07-02 ENCOUNTER — Telehealth (INDEPENDENT_AMBULATORY_CARE_PROVIDER_SITE_OTHER): Payer: 59 | Admitting: Psychiatry

## 2020-07-02 ENCOUNTER — Other Ambulatory Visit (HOSPITAL_COMMUNITY): Payer: Self-pay | Admitting: Psychiatry

## 2020-07-02 ENCOUNTER — Encounter (HOSPITAL_COMMUNITY): Payer: Self-pay | Admitting: Psychiatry

## 2020-07-02 DIAGNOSIS — F330 Major depressive disorder, recurrent, mild: Secondary | ICD-10-CM | POA: Diagnosis not present

## 2020-07-02 DIAGNOSIS — F411 Generalized anxiety disorder: Secondary | ICD-10-CM | POA: Diagnosis not present

## 2020-07-02 MED ORDER — HYDROXYZINE PAMOATE 25 MG PO CAPS: 25.0000 mg | ORAL_CAPSULE | Freq: Every evening | ORAL | 0 refills | Status: DC | PRN

## 2020-07-02 MED ORDER — BUPROPION HCL ER (XL) 300 MG PO TB24: 300.0000 mg | ORAL_TABLET | Freq: Every day | ORAL | 1 refills | Status: DC

## 2020-07-02 MED FILL — buPROPion HCL ER (XL) 300 M: 300 | 30 days supply | Qty: 30 | Fill #0

## 2020-07-02 MED FILL — HYDROXYZINE PAMOATE 25 MG C: 25 | 20 days supply | Qty: 40 | Fill #0
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
## 2020-07-04 LAB — TESTOS,TOTAL,FREE AND SHBG (FEMALE)
Free Testosterone: 5 pg/mL (ref 0.1–6.4)
Sex Hormone Binding: 12 nmol/L — ABNORMAL LOW (ref 17–124)
Testosterone, Total, LC-MS-MS: 23 ng/dL (ref 2–45)

## 2020-07-06 LAB — 17-HYDROXYPROGESTERONE: 17-OH-Progesterone, LC/MS/MS: 46 ng/dL

## 2020-07-06 LAB — ANTI-MULLERIAN HORMONE (AMH), FEMALE: Anti-Mullerian Hormones(AMH), Female: 14.51 ng/mL — ABNORMAL HIGH (ref 0.69–13.39)

## 2020-07-06 LAB — DHEA-SULFATE: DHEA-SO4: 220 ug/dL (ref 14–349)

## 2020-07-13 ENCOUNTER — Ambulatory Visit (HOSPITAL_COMMUNITY): Payer: 59 | Admitting: Clinical

## 2020-07-22 ENCOUNTER — Ambulatory Visit (INDEPENDENT_AMBULATORY_CARE_PROVIDER_SITE_OTHER): Payer: 59 | Admitting: Clinical

## 2020-07-22 ENCOUNTER — Other Ambulatory Visit: Payer: Self-pay

## 2020-07-22 DIAGNOSIS — F330 Major depressive disorder, recurrent, mild: Secondary | ICD-10-CM | POA: Diagnosis not present

## 2020-07-22 DIAGNOSIS — F411 Generalized anxiety disorder: Secondary | ICD-10-CM | POA: Diagnosis not present
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
## 2020-08-02 ENCOUNTER — Other Ambulatory Visit (HOSPITAL_COMMUNITY): Payer: Self-pay | Admitting: Psychiatry

## 2020-08-02 ENCOUNTER — Other Ambulatory Visit: Payer: Self-pay

## 2020-08-02 ENCOUNTER — Encounter (HOSPITAL_COMMUNITY): Payer: Self-pay | Admitting: Psychiatry

## 2020-08-02 ENCOUNTER — Telehealth (INDEPENDENT_AMBULATORY_CARE_PROVIDER_SITE_OTHER): Payer: 59 | Admitting: Psychiatry

## 2020-08-02 VITALS — Wt 155.0 lb

## 2020-08-02 DIAGNOSIS — F330 Major depressive disorder, recurrent, mild: Secondary | ICD-10-CM | POA: Diagnosis not present

## 2020-08-02 DIAGNOSIS — F411 Generalized anxiety disorder: Secondary | ICD-10-CM

## 2020-08-02 DIAGNOSIS — F509 Eating disorder, unspecified: Secondary | ICD-10-CM | POA: Diagnosis not present

## 2020-08-02 MED ORDER — BUPROPION HCL ER (XL) 300 MG PO TB24: 300.0000 mg | ORAL_TABLET | Freq: Every day | ORAL | 1 refills | Status: DC

## 2020-08-02 MED ORDER — BUSPIRONE HCL 10 MG PO TABS: 10.0000 mg | ORAL_TABLET | Freq: Two times a day (BID) | ORAL | 0 refills | Status: DC

## 2020-08-02 MED FILL — buPROPion HCL ER (XL) 300 M: 300 | 30 days supply | Qty: 30 | Fill #1

## 2020-08-02 MED FILL — busPIRone HCL 10 MG TABS: 10 | 90 days supply | Qty: 180 | Fill #0
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
## 2020-08-04 ENCOUNTER — Other Ambulatory Visit (HOSPITAL_COMMUNITY): Payer: Self-pay

## 2020-08-04 DIAGNOSIS — Z79899 Other long term (current) drug therapy: Secondary | ICD-10-CM

## 2020-08-05 ENCOUNTER — Ambulatory Visit (HOSPITAL_COMMUNITY): Payer: 59 | Admitting: Clinical

## 2020-08-09 ENCOUNTER — Ambulatory Visit (INDEPENDENT_AMBULATORY_CARE_PROVIDER_SITE_OTHER): Payer: 59 | Admitting: Clinical

## 2020-08-09 ENCOUNTER — Other Ambulatory Visit (HOSPITAL_BASED_OUTPATIENT_CLINIC_OR_DEPARTMENT_OTHER): Payer: Self-pay

## 2020-08-09 ENCOUNTER — Other Ambulatory Visit (HOSPITAL_COMMUNITY): Payer: Self-pay | Admitting: *Deleted

## 2020-08-09 ENCOUNTER — Other Ambulatory Visit: Payer: Self-pay

## 2020-08-09 DIAGNOSIS — F411 Generalized anxiety disorder: Secondary | ICD-10-CM | POA: Diagnosis not present

## 2020-08-09 DIAGNOSIS — F330 Major depressive disorder, recurrent, mild: Secondary | ICD-10-CM | POA: Diagnosis not present

## 2020-08-09 MED ORDER — TRAZODONE HCL 50 MG PO TABS
ORAL_TABLET | ORAL | 0 refills | Status: DC
  Filled 2020-08-09: qty 30, 30d supply, fill #0

## 2020-08-09 NOTE — Progress Notes (Signed)
   THERAPIST PROGRESS NOTE  Session Time: 1pm  Participation Level: Active  Behavioral Response: Casual appearance, responsive, active  Type of Therapy: individual therapy  Treatment Goals addressed: Anxiety  Interventions: CBT Virtual Visit via Telephone Note  I connected with Jennifer Levy on 08/09/20 at  1:00 PM EDT by telephone and verified that I am speaking with the correct person using two identifiers.  Location: Patient: Home Provider: Office   I discussed the limitations, risks, security and privacy concerns of performing an evaluation and management service by telephone and the availability of in person appointments. I also discussed with the patient that there may be a patient responsible charge related to this service. The patient expressed understanding and agreed to proceed.   I discussed the assessment and treatment plan with the patient. The patient was provided an opportunity to ask questions and all were answered. The patient agreed with the plan and demonstrated an understanding of the instructions.   The patient was advised to call back or seek an in-person evaluation if the symptoms worsen or if the condition fails to improve as anticipated.  I provided 50 minutes of non-face-to-face time during this encounter. Summary: Jennifer Levy is a 30 y.o. 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 Pt says she is looking forward to this and is hopeful this job will produce less stress. Pt also reports she is involved in a new relationship with her partner and it is going well. Pt describes her mood as low mood and anxiety most days, however says it is improving with the use of medication management. Pt reports she is responding well to the medication. Pt identifies her job as her greatest stressor and most anxiety producing. Pt admits having a tendency "to push through and call out of work" as response to work related  stress.  Suicidal/Homicidal: Denies SI/HI no plan or intent to harm self or others reported. Pt encouraged to utilize mental health resources provided in the event of an emergency.  Therapist Response: CSW assisted pt in completion of treatment plan.CSW  prompt pt to identify feelings, triggers, behavioral response and consequences to daily stressor. Provided pt with information on the cycle of anxiety. Processed with pt about socratic questions to challenge automatic negative thoughts.  Plan: Return again in 2 weeks.  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

## 2020-08-10 ENCOUNTER — Other Ambulatory Visit (HOSPITAL_BASED_OUTPATIENT_CLINIC_OR_DEPARTMENT_OTHER): Payer: Self-pay

## 2020-09-01 DIAGNOSIS — Z20822 Contact with and (suspected) exposure to covid-19: Secondary | ICD-10-CM | POA: Diagnosis not present

## 2020-09-03 ENCOUNTER — Other Ambulatory Visit (HOSPITAL_BASED_OUTPATIENT_CLINIC_OR_DEPARTMENT_OTHER): Payer: Self-pay

## 2020-09-03 ENCOUNTER — Other Ambulatory Visit: Payer: Self-pay | Admitting: Osteopathic Medicine

## 2020-09-03 DIAGNOSIS — F418 Other specified anxiety disorders: Secondary | ICD-10-CM

## 2020-09-03 MED ORDER — ALPRAZOLAM 0.5 MG PO TABS
ORAL_TABLET | ORAL | 0 refills | Status: DC
  Filled 2020-09-03: qty 30, 90d supply, fill #0

## 2020-09-03 MED FILL — Bupropion HCl Tab ER 24HR 300 MG: ORAL | 30 days supply | Qty: 30 | Fill #0 | Status: AC

## 2020-09-09 ENCOUNTER — Other Ambulatory Visit (HOSPITAL_BASED_OUTPATIENT_CLINIC_OR_DEPARTMENT_OTHER): Payer: Self-pay

## 2020-09-09 MED FILL — Cariprazine HCl Cap 4.5 MG (Base Equivalent): ORAL | 90 days supply | Qty: 90 | Fill #0 | Status: CN

## 2020-09-10 ENCOUNTER — Other Ambulatory Visit (HOSPITAL_BASED_OUTPATIENT_CLINIC_OR_DEPARTMENT_OTHER): Payer: Self-pay

## 2020-09-13 ENCOUNTER — Telehealth (HOSPITAL_COMMUNITY): Payer: 59 | Admitting: Psychiatry

## 2020-09-13 ENCOUNTER — Other Ambulatory Visit: Payer: Self-pay

## 2020-09-15 ENCOUNTER — Other Ambulatory Visit (HOSPITAL_BASED_OUTPATIENT_CLINIC_OR_DEPARTMENT_OTHER): Payer: Self-pay

## 2020-09-17 ENCOUNTER — Other Ambulatory Visit (HOSPITAL_BASED_OUTPATIENT_CLINIC_OR_DEPARTMENT_OTHER): Payer: Self-pay

## 2020-09-20 ENCOUNTER — Other Ambulatory Visit (HOSPITAL_BASED_OUTPATIENT_CLINIC_OR_DEPARTMENT_OTHER): Payer: Self-pay

## 2020-09-20 ENCOUNTER — Other Ambulatory Visit (HOSPITAL_COMMUNITY): Payer: Self-pay | Admitting: Psychiatry

## 2020-09-20 MED FILL — Cariprazine HCl Cap 4.5 MG (Base Equivalent): ORAL | 90 days supply | Qty: 90 | Fill #0 | Status: AC

## 2020-09-20 MED FILL — Norgestimate & Ethinyl Estradiol Tab 0.25 MG-35 MCG: ORAL | 28 days supply | Qty: 28 | Fill #0 | Status: AC

## 2020-09-22 ENCOUNTER — Other Ambulatory Visit (HOSPITAL_BASED_OUTPATIENT_CLINIC_OR_DEPARTMENT_OTHER): Payer: Self-pay

## 2020-09-22 ENCOUNTER — Other Ambulatory Visit (HOSPITAL_COMMUNITY): Payer: Self-pay | Admitting: Psychiatry

## 2020-09-24 ENCOUNTER — Other Ambulatory Visit (HOSPITAL_BASED_OUTPATIENT_CLINIC_OR_DEPARTMENT_OTHER): Payer: Self-pay

## 2020-09-27 ENCOUNTER — Other Ambulatory Visit (HOSPITAL_BASED_OUTPATIENT_CLINIC_OR_DEPARTMENT_OTHER): Payer: Self-pay

## 2020-09-28 ENCOUNTER — Other Ambulatory Visit (HOSPITAL_BASED_OUTPATIENT_CLINIC_OR_DEPARTMENT_OTHER): Payer: Self-pay

## 2020-09-29 ENCOUNTER — Other Ambulatory Visit (HOSPITAL_BASED_OUTPATIENT_CLINIC_OR_DEPARTMENT_OTHER): Payer: Self-pay

## 2020-10-10 MED FILL — Norgestimate & Ethinyl Estradiol Tab 0.25 MG-35 MCG: ORAL | 28 days supply | Qty: 28 | Fill #1 | Status: CN

## 2020-10-10 MED FILL — Bupropion HCl Tab ER 24HR 300 MG: ORAL | 30 days supply | Qty: 30 | Fill #1 | Status: AC

## 2020-10-11 ENCOUNTER — Other Ambulatory Visit (HOSPITAL_BASED_OUTPATIENT_CLINIC_OR_DEPARTMENT_OTHER): Payer: Self-pay

## 2020-10-12 ENCOUNTER — Other Ambulatory Visit (HOSPITAL_BASED_OUTPATIENT_CLINIC_OR_DEPARTMENT_OTHER): Payer: Self-pay

## 2020-10-12 MED FILL — Norgestimate & Ethinyl Estradiol Tab 0.25 MG-35 MCG: ORAL | 28 days supply | Qty: 28 | Fill #1 | Status: AC

## 2020-10-14 ENCOUNTER — Other Ambulatory Visit: Payer: Self-pay

## 2020-10-15 ENCOUNTER — Ambulatory Visit (INDEPENDENT_AMBULATORY_CARE_PROVIDER_SITE_OTHER): Payer: 59 | Admitting: Osteopathic Medicine

## 2020-10-15 VITALS — BP 117/79 | HR 83 | Temp 98.7°F | Wt 152.0 lb

## 2020-10-15 DIAGNOSIS — Z0000 Encounter for general adult medical examination without abnormal findings: Secondary | ICD-10-CM | POA: Diagnosis not present

## 2020-10-15 NOTE — Progress Notes (Signed)
0

## 2020-10-15 NOTE — Patient Instructions (Signed)
General Preventive Care Most recent routine screening labs: ordered.  Blood pressure goal 130/80 or less.  Tobacco: don't! Please let me know if you need help quitting! Alcohol: responsible moderation is ok for most adults - if you have concerns about your alcohol intake, please talk to me!  Exercise: as tolerated to reduce risk of cardiovascular disease and diabetes. Strength training will also prevent osteoporosis.  Mental health: if need for mental health care (medicines, counseling, other), or concerns about moods, please let me know!  Sexual / Reproductive health: if need for STD testing, or if concerns with libido/pain problems, if need to discuss family planning, please let me or OBGYN know!  Advanced Directive: Living Will and/or Healthcare Power of Attorney recommended for all adults, regardless of age or health.  Vaccines Flu vaccine: for almost everyone, every fall.  Tetanus booster: every 10 years due 2029 / 3rd trimester of pregnancy HPV vaccine: Gardasil up to age 55 to prevent HPV-associated diseases, including certain cancers.  COVID vaccine: THANKS for getting your vaccine! :) Cancer screenings  Colon cancer screening: for everyone age 8-75.  Breast cancer screening: mammogram at age 85 Cervical cancer screening: Pap every 3 years if normal <age 62, every 5 years if normal age 40 and over.  Will be due 02/2021 Lung cancer screening: not needed for nonsmokers  Infection screenings  HIV: recommended screening at least once age 61-65, more often as needed. Gonorrhea/Chlamydia: screening as needed Hepatitis C: recommended once for everyone age 98-61 TB: certain at-risk populations

## 2020-10-15 NOTE — Progress Notes (Signed)
Jennifer Levy is a 30 y.o. female who presents to  [facility]  today, 10/15/20, seeking care for the following:  Annual physical - no additional complaints today. She is doing well mentally, current meds are helping and she is seeing a therapist. Following w/ [facility] with other pertinent findings:  The encounter diagnosis was Annual physical exam.    Patient Instructions  General Preventive Care Most recent routine screening labs: ordered.  Blood pressure goal 130/80 or less.  Tobacco: don't! Please let me know if you need help quitting! Alcohol: responsible moderation is ok for most adults - if you have concerns about your alcohol intake, please talk to me!  Exercise: as tolerated to reduce risk of cardiovascular disease and diabetes. Strength training will also prevent osteoporosis.  Mental health: if need for mental health care (medicines, counseling, other), or concerns about moods, please let me know!  Sexual / Reproductive health: if need for STD testing, or if concerns with libido/pain problems, if need to discuss family planning, please let me or OBGYN know!  Advanced Directive: Living Will and/or Healthcare Power of Attorney recommended for all adults, regardless of age or health.  Vaccines Flu vaccine: for almost everyone, every fall.  Tetanus booster: every 10 years due 2029 / 3rd trimester of pregnancy HPV vaccine: Gardasil up to age 70 to prevent HPV-associated diseases, including certain cancers.  COVID vaccine: THANKS for getting your vaccine! :) Cancer screenings  Colon cancer screening: for everyone age 65-75.  Breast cancer screening: mammogram at age 34 Cervical cancer screening: Pap every 3 years if normal <age 6, every 5 years if normal age 56 and over.  Will be due 02/2021 Lung cancer screening: not needed for nonsmokers  Infection screenings  HIV: recommended screening at  least once age 12-65, more often as needed. Gonorrhea/Chlamydia: screening as needed Hepatitis C: recommended once for everyone age 34-74 TB: certain at-risk populations  Orders Placed This Encounter  Procedures   CBC   COMPLETE METABOLIC PANEL WITH GFR   Lipid panel    No orders of the defined types were placed in this encounter.    See below for relevant physical exam findings  See below for recent lab and imaging results reviewed  Medications, allergies, PMH, PSH, SocH, [place] reviewed below    Follow-up instructions: Return in about 1 year (around 10/15/2021) for [place] (call week prior to visit for lab orders).                                        Exam:  BP 117/79 (BP Location: Left Arm, Patient Position: Sitting, Cuff Size: Normal)   Pulse 83   Temp 98.7 F (37.1 C) (Oral)   Wt 152 lb 0.6 oz (69 kg)   BMI 26.10 kg/m  Constitutional: VS see above. General Appearance: alert, well-developed, well-nourished, NAD Neck: No masses, trachea midline.  Respiratory: Normal respiratory effort. no wheeze, no rhonchi, no rales Cardiovascular: S1/S2 normal, no murmur, no rub/gallop auscultated. RRR.  Musculoskeletal: Gait normal. Symmetric and independent movement of all extremities Abdominal: non-tender, non-distended, no appreciable organomegaly, neg Murphy's, BS WNLx4 Neurological: Normal balance/coordination. No tremor. Skin: warm, dry, intact.  Psychiatric: Normal judgment/insight. Normal mood and affect. Oriented x3.   Current Meds  Medication Sig   ALPRAZolam (XANAX) 0.5 MG tablet TAKE  1/2 - 1 TABLET BY MOUTH DAILY AS NEEDED FOR ANXIETY **USE SPARINGLY. MUST LAST 90 DAYS**   buPROPion (WELLBUTRIN XL) 300 MG 24 hr tablet TAKE 1 TABLET (300 MG TOTAL) BY MOUTH DAILY.   busPIRone (BUSPAR) 10 MG tablet TAKE 1 TABLET (10 MG TOTAL) BY MOUTH 2 (TWO) TIMES DAILY.   ibuprofen (ADVIL) 100 MG tablet Take 100 mg by mouth every 6 (six) hours as  needed for fever.   norgestimate-ethinyl estradiol (ORTHO-CYCLEN) 0.25-35 MG-MCG tablet TAKE 1 TABLET BY MOUTH DAILY.   traZODone (DESYREL) 50 MG tablet Take 1/2 to 1 tablet by mouth nightly as needed for sleep.    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/2016   Intermittent palpitations 03/13/2016    Family History  Problem Relation Age of Onset   Depression Mother    Alcohol abuse Brother    Stroke Maternal Aunt    Alcohol abuse Maternal Uncle    Diabetes Maternal Grandfather    Cancer Maternal Grandmother        breast     Social History   Tobacco Use  Smoking Status Never  Smokeless Tobacco Never    Past Surgical History:  Procedure Laterality Date   BREAST REDUCTION SURGERY Bilateral 05/28/2019   Procedure: BILATERAL MAMMARY REDUCTION  (BREAST);  Surgeon: Wallace Going, DO;  Location: [facility];  Service: Plastics;  Laterality: Bilateral;  3.5 hours   TMJ ARTHROSCOPY  2012   WISDOM TOOTH EXTRACTION  2011    Immunization History  Administered Date(s) Administered   Influenza,inj,Quad PF,6+ Mos 02/11/2019   Influenza-Unspecified 01/23/2017, 01/21/2018, 01/30/2020   Moderna Sars-Covid-2 Vaccination 06/03/2019, 05/05/2020, 05/16/2020   Tdap 09/28/2017    No results found for this or any previous visit (from the past 2160 hour(s)).  No results found.     All questions at time of visit were answered - patient instructed to contact office with any additional concerns or updates. ER/RTC precautions were reviewed with the patient as applicable.   Please note: manual typing as well as voice recognition software may have been used to produce this document - typos may  escape review. Please contact Dr. Sheppard Coil for any needed clarifications.

## 2020-10-16 LAB — COMPLETE METABOLIC PANEL WITH GFR
AG Ratio: 1.5 (calc) (ref 1.0–2.5)
ALT: 59 U/L — ABNORMAL HIGH (ref 6–29)
AST: 24 U/L (ref 10–30)
Albumin: 4.4 g/dL (ref 3.6–5.1)
Alkaline phosphatase (APISO): 63 U/L (ref 31–125)
BUN: 11 mg/dL (ref 7–25)
CO2: 25 mmol/L (ref 20–32)
Calcium: 9.6 mg/dL (ref 8.6–10.2)
Chloride: 103 mmol/L (ref 98–110)
Creat: 0.86 mg/dL (ref 0.50–1.10)
GFR, Est African American: 107 mL/min/{1.73_m2} (ref >=60)
GFR, Est Non African American: 92 mL/min/{1.73_m2} (ref >=60)
Globulin: 2.9 g/dL (calc) (ref 1.9–3.7)
Glucose, Bld: 99 mg/dL (ref 65–99)
Potassium: 4.6 mmol/L (ref 3.5–5.3)
Sodium: 137 mmol/L (ref 135–146)
Total Bilirubin: 0.4 mg/dL (ref 0.2–1.2)
Total Protein: 7.3 g/dL (ref 6.1–8.1)

## 2020-10-16 LAB — CBC
HCT: 42.9 % (ref 35.0–45.0)
Hemoglobin: 14.3 g/dL (ref 11.7–15.5)
MCH: 30 pg (ref 27.0–33.0)
MCHC: 33.3 g/dL (ref 32.0–36.0)
MCV: 90.1 fL (ref 80.0–100.0)
MPV: 10.4 fL (ref 7.5–12.5)
Platelets: 345 10*3/uL (ref 140–400)
RBC: 4.76 10*6/uL (ref 3.80–5.10)
RDW: 12.1 % (ref 11.0–15.0)
WBC: 4.8 10*3/uL (ref 3.8–10.8)

## 2020-10-16 LAB — LIPID PANEL
Cholesterol: 197 mg/dL (ref <200)
HDL: 53 mg/dL (ref >=50)
LDL Cholesterol (Calc): 116 mg/dL (calc) — ABNORMAL HIGH
Non-HDL Cholesterol (Calc): 144 mg/dL (calc) — ABNORMAL HIGH (ref <130)
Total CHOL/HDL Ratio: 3.7 (calc) (ref <5.0)
Triglycerides: 161 mg/dL — ABNORMAL HIGH (ref <150)

## 2020-11-09 ENCOUNTER — Other Ambulatory Visit (HOSPITAL_COMMUNITY): Payer: Self-pay | Admitting: Psychiatry

## 2020-11-09 ENCOUNTER — Other Ambulatory Visit (HOSPITAL_BASED_OUTPATIENT_CLINIC_OR_DEPARTMENT_OTHER): Payer: Self-pay

## 2020-11-09 DIAGNOSIS — F411 Generalized anxiety disorder: Secondary | ICD-10-CM

## 2020-11-09 DIAGNOSIS — F330 Major depressive disorder, recurrent, mild: Secondary | ICD-10-CM

## 2020-11-09 MED FILL — Norgestimate & Ethinyl Estradiol Tab 0.25 MG-35 MCG: ORAL | 28 days supply | Qty: 28 | Fill #2 | Status: AC

## 2020-11-16 ENCOUNTER — Encounter: Payer: Self-pay | Admitting: Osteopathic Medicine

## 2020-12-06 ENCOUNTER — Other Ambulatory Visit (HOSPITAL_BASED_OUTPATIENT_CLINIC_OR_DEPARTMENT_OTHER): Payer: Self-pay

## 2020-12-06 MED FILL — Norgestimate & Ethinyl Estradiol Tab 0.25 MG-35 MCG: ORAL | 28 days supply | Qty: 28 | Fill #3 | Status: AC

## 2020-12-08 ENCOUNTER — Other Ambulatory Visit (HOSPITAL_BASED_OUTPATIENT_CLINIC_OR_DEPARTMENT_OTHER): Payer: Self-pay

## 2020-12-14 ENCOUNTER — Other Ambulatory Visit: Payer: Self-pay

## 2020-12-14 ENCOUNTER — Ambulatory Visit (INDEPENDENT_AMBULATORY_CARE_PROVIDER_SITE_OTHER): Payer: 59 | Admitting: Clinical

## 2020-12-14 DIAGNOSIS — F330 Major depressive disorder, recurrent, mild: Secondary | ICD-10-CM | POA: Diagnosis not present

## 2020-12-14 DIAGNOSIS — F411 Generalized anxiety disorder: Secondary | ICD-10-CM | POA: Diagnosis not present

## 2020-12-14 DIAGNOSIS — Z566 Other physical and mental strain related to work: Secondary | ICD-10-CM | POA: Diagnosis not present

## 2020-12-14 NOTE — Progress Notes (Signed)
   THERAPIST PROGRESS NOTE  Session Time: 3pm  Participation Level: Active  Behavioral Response: NAAlertAnxious  Type of Therapy: Individual Therapy  Treatment Goals addressed: Anxiety and Coping  Interventions: CBT Virtual Visit via Telephone Note  I connected with Jennifer Levy on 12/14/20 at  3:00 PM EDT by telephone and verified that I am speaking with the correct person using two identifiers.  Location: Patient: home Provider: office   I discussed the limitations, risks, security and privacy concerns of performing an evaluation and management service by telephone and the availability of in person appointments. I also discussed with the patient that there may be a patient responsible charge related to this service. The patient expressed understanding and agreed to proceed.   I discussed the assessment and treatment plan with the patient. The patient was provided an opportunity to ask questions and all were answered. The patient agreed with the plan and demonstrated an understanding of the instructions.   The patient was advised to call back or seek an in-person evaluation if the symptoms worsen or if the condition fails to improve as anticipated.  I provided 40 minutes of non-face-to-face time during this encounter.   Summary: Jennifer Levy is a 30 y.o. female who presents in an anxious mood. Pt last received individual therapy on 08/09/20. Pt reports work related stress and says she is in the process of requesting a leave of absence from work. Pt says her job is very demanding and she is considering finding a new job. Pt endorses racing thoughts,panic attacks, constant worry, irritability. Pt reports recent changes occurring such as her boyfriend leaving for training for one month and her best friend moving away. Pt says she has not been practicing good self care. Pt states she enjoys jogging but has not been consistently doing this. Additionally pt says she has not been  consistently working on challenging automatic negative thoughts and states catastrophisizing situations she is in.  Suicidal/Homicidal: Pt denies SI/HI no plan, intent or attempts to harm self reported. Pt states she experiences self defeating thoughts that she finds difficult to manage. Pt encouraged to utilize crisis resources previously provided and call 911 or go to closest emergency dept in the event of an emergency.  Therapist Response: CSW assessed for changes in mood and behavior. CSW discussed with pt putting her thoughts on trial to challenge irrational beliefs. CSW informed pt catastrophisizing is a cognitive distortion and how irrational thoughts can influence emotions. CSW prompted pt to identify consequences she has received due to irrational thinking. CSW further processed with pt using socratic questions to challenge automatic negative thoughts.  Plan: Return again in 2 weeks.  Diagnosis: Axis I: generalized anxiety disorder                                     Mild episode of recurrent major depressive d/o    Work related stress    Axis II: No diagnosis    Yvette Rack, LCSW 12/14/2020

## 2020-12-16 ENCOUNTER — Telehealth (INDEPENDENT_AMBULATORY_CARE_PROVIDER_SITE_OTHER): Payer: 59 | Admitting: Psychiatry

## 2020-12-16 ENCOUNTER — Other Ambulatory Visit: Payer: Self-pay

## 2020-12-16 ENCOUNTER — Encounter (HOSPITAL_COMMUNITY): Payer: Self-pay | Admitting: Psychiatry

## 2020-12-16 ENCOUNTER — Other Ambulatory Visit (HOSPITAL_BASED_OUTPATIENT_CLINIC_OR_DEPARTMENT_OTHER): Payer: Self-pay

## 2020-12-16 VITALS — Wt 152.0 lb

## 2020-12-16 DIAGNOSIS — F509 Eating disorder, unspecified: Secondary | ICD-10-CM

## 2020-12-16 DIAGNOSIS — F330 Major depressive disorder, recurrent, mild: Secondary | ICD-10-CM

## 2020-12-16 DIAGNOSIS — F411 Generalized anxiety disorder: Secondary | ICD-10-CM | POA: Diagnosis not present

## 2020-12-16 MED ORDER — CARIPRAZINE HCL 4.5 MG PO CAPS
1.0000 | ORAL_CAPSULE | Freq: Every day | ORAL | 0 refills | Status: DC
  Filled 2020-12-16: qty 90, 90d supply, fill #0

## 2020-12-16 MED ORDER — BUSPIRONE HCL 10 MG PO TABS
ORAL_TABLET | Freq: Two times a day (BID) | ORAL | 0 refills | Status: DC
  Filled 2020-12-16: qty 180, 90d supply, fill #0

## 2020-12-16 MED ORDER — TRAZODONE HCL 50 MG PO TABS
ORAL_TABLET | ORAL | 0 refills | Status: DC
Start: 2020-12-16 — End: 2021-01-13
  Filled 2020-12-16: qty 30, 30d supply, fill #0

## 2020-12-16 MED ORDER — BUPROPION HCL ER (XL) 300 MG PO TB24
ORAL_TABLET | Freq: Every day | ORAL | 0 refills | Status: DC
  Filled 2020-12-16: qty 30, 30d supply, fill #0
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
## 2020-12-17 ENCOUNTER — Other Ambulatory Visit (HOSPITAL_BASED_OUTPATIENT_CLINIC_OR_DEPARTMENT_OTHER): Payer: Self-pay

## 2020-12-28 ENCOUNTER — Other Ambulatory Visit: Payer: Self-pay

## 2020-12-28 ENCOUNTER — Ambulatory Visit (INDEPENDENT_AMBULATORY_CARE_PROVIDER_SITE_OTHER): Payer: 59 | Admitting: Clinical

## 2020-12-28 DIAGNOSIS — F411 Generalized anxiety disorder: Secondary | ICD-10-CM

## 2020-12-28 DIAGNOSIS — F330 Major depressive disorder, recurrent, mild: Secondary | ICD-10-CM | POA: Diagnosis not present

## 2020-12-28 DIAGNOSIS — Z566 Other physical and mental strain related to work: Secondary | ICD-10-CM | POA: Diagnosis not present

## 2020-12-28 NOTE — Progress Notes (Signed)
   THERAPIST PROGRESS NOTE  Session Time: 1pm  Participation Level: Active  Behavioral Response: NAAlert"content"  Type of Therapy: Individual Therapy  Treatment Goals addressed: Anxiety and Coping  Interventions: CBT Virtual Visit via Telephone Note  I connected with Jennifer Levy on 12/28/20 at  1:00 PM EDT by telephone and verified that I am speaking with the correct person using two identifiers.  Location: Patient: home Provider: office   I discussed the limitations, risks, security and privacy concerns of performing an evaluation and management service by telephone and the availability of in person appointments. I also discussed with the patient that there may be a patient responsible charge related to this service. The patient expressed understanding and agreed to proceed.   I discussed the assessment and treatment plan with the patient. The patient was provided an opportunity to ask questions and all were answered. The patient agreed with the plan and demonstrated an understanding of the instructions.   The patient was advised to call back or seek an in-person evaluation if the symptoms worsen or if the condition fails to improve as anticipated.  I provided 41 minutes of non-face-to-face time during this encounter.   Summary: Jennifer Levy is a 30 y.o. female who describes her mood as "content" Pt reports she is visiting her parents this week. Pt endorses poor sleep pattern and feeling unmotivated to complete task. Pt reports heightened anxiety that prevents her from completing daily activities. When asked what needs to occur to improve anxiety, pt states "I need to be more consistent with therapy and my meds" Pt states psychiatrist(DrArfeen) has also discussed with her importance of consistency and taking medications as prescribed. 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 Pt says she is looking for a traditional work  schedule. Minimal progress made toward achieving treatment goals at this time.  Suicidal/Homicidal: Pt denies SI/HI no plan, intent or attempt to harm self or others reported.  Therapist Response: CSW reviewed and provided pt with information on self compassion. CSW prompted pt to identify times she shows compassion to others. CSW probed for feedback on times she has shown grace and kindness to herself. CSW reviewed socratic questions to challenge irrational thoughts. CSW discussed with pt importance of being intentional with creating self care routine and practicing interventions discussed during sessions.  Plan: Return again in 2 weeks.  Diagnosis: Axis I: generalized anxiety disorder                                     Mild episode of recurrent major depressive d/o                                     Work related stress    Axis II: No diagnosis    Yvette Rack, LCSW 12/28/2020

## 2021-01-13 ENCOUNTER — Encounter (HOSPITAL_COMMUNITY): Payer: Self-pay | Admitting: Psychiatry

## 2021-01-13 ENCOUNTER — Other Ambulatory Visit (HOSPITAL_BASED_OUTPATIENT_CLINIC_OR_DEPARTMENT_OTHER): Payer: Self-pay

## 2021-01-13 ENCOUNTER — Telehealth (HOSPITAL_COMMUNITY): Payer: Self-pay | Admitting: Psychiatry

## 2021-01-13 ENCOUNTER — Other Ambulatory Visit: Payer: Self-pay

## 2021-01-13 ENCOUNTER — Telehealth (INDEPENDENT_AMBULATORY_CARE_PROVIDER_SITE_OTHER): Payer: 59 | Admitting: Psychiatry

## 2021-01-13 VITALS — Wt 152.0 lb

## 2021-01-13 DIAGNOSIS — F411 Generalized anxiety disorder: Secondary | ICD-10-CM | POA: Diagnosis not present

## 2021-01-13 DIAGNOSIS — F330 Major depressive disorder, recurrent, mild: Secondary | ICD-10-CM

## 2021-01-13 DIAGNOSIS — F509 Eating disorder, unspecified: Secondary | ICD-10-CM | POA: Diagnosis not present

## 2021-01-13 MED ORDER — CLONAZEPAM 0.5 MG PO TABS
0.5000 mg | ORAL_TABLET | Freq: Two times a day (BID) | ORAL | 0 refills | Status: DC | PRN
  Filled 2021-01-13: qty 30, 15d supply, fill #0

## 2021-01-13 MED ORDER — TRAZODONE HCL 100 MG PO TABS
ORAL_TABLET | ORAL | 0 refills | Status: DC
  Filled 2021-01-13: qty 30, 30d supply, fill #0

## 2021-01-13 MED ORDER — BUPROPION HCL ER (XL) 300 MG PO TB24
ORAL_TABLET | Freq: Every day | ORAL | 0 refills | Status: DC
  Filled 2021-01-13: qty 30, 30d supply, fill #0
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
## 2021-01-13 NOTE — Telephone Encounter (Signed)
D:  Dr. Adele Schilder referred pt to [place] [facility].  A:  Placed call to pt, but there was no answer.  Left vm requesting pt to call the case manager back.  Inform Dr. Adele Schilder.

## 2021-01-14 ENCOUNTER — Other Ambulatory Visit (HOSPITAL_BASED_OUTPATIENT_CLINIC_OR_DEPARTMENT_OTHER): Payer: Self-pay

## 2021-01-14 MED FILL — Norgestimate & Ethinyl Estradiol Tab 0.25 MG-35 MCG: ORAL | 28 days supply | Qty: 28 | Fill #4 | Status: AC

## 2021-01-19 NOTE — Telephone Encounter (Signed)
we can only extend 1 week and she must do the program or go back to work.

## 2021-01-19 NOTE — Telephone Encounter (Signed)
Patient called regarding her extension on her FMLA paperwork. Writer read in the last office visit notes with you that the IOP program and extending her fmla until she finishes the program had been discussed. She had also stated that she's out of town right now and can't start the program until next week. What date are we extending her FMLA to? Please review and advise. Thank you

## 2021-01-20 ENCOUNTER — Telehealth (HOSPITAL_COMMUNITY): Payer: Self-pay | Admitting: Psychiatry

## 2021-01-20 NOTE — Telephone Encounter (Signed)
D:  Placed call to pt earlier, in order to orient and provide her with a start date for MH-IOP as per Dr. Adele Schilder.  There was no answer.  A:  Left vm for pt to call the case manager back.  Case manager will reach out to pt tomorrow also.  Inform Dr. Adele Schilder.

## 2021-01-21 ENCOUNTER — Telehealth (HOSPITAL_COMMUNITY): Payer: Self-pay | Admitting: Psychiatry

## 2021-01-21 ENCOUNTER — Telehealth: Payer: Self-pay | Admitting: Osteopathic Medicine

## 2021-01-21 DIAGNOSIS — R748 Abnormal levels of other serum enzymes: Secondary | ICD-10-CM

## 2021-01-21 NOTE — Telephone Encounter (Addendum)
-----   Message from Jennifer Reeve, DO sent at 10/18/2020  4:56 PM EDT ----- Need to recheck CMP from abnormal ALT 3 mos ago  Please call pt - f she'd like to get labs here vs downstairs, and can place order for CMP  THanks!

## 2021-01-25 ENCOUNTER — Telehealth (HOSPITAL_COMMUNITY): Payer: Self-pay | Admitting: Psychiatry

## 2021-01-25 NOTE — Telephone Encounter (Addendum)
D:  Dr. Adele Schilder had referred pt to [facility] last week; pt just now returning case manager's phone call from 01-13-21 and 01-20-21.  A:  Oriented pt.  Pt states she would like to verify her insurance benefits first before making a decision.  Case manager reminded pt that Dr.  [place] office had extended her from work thru 01-30-21 (with rtw on 01-31-21); therefore time is of the essence.  Encouraged pt to call her insurance co today and call case manager back if she would like to start [place].  Inform Dr. Adele Schilder.

## 2021-01-25 NOTE — Telephone Encounter (Signed)
Pt aware and would like to come to our office to have the labs drawn. Order for a CMP entered.

## 2021-01-27 ENCOUNTER — Telehealth (HOSPITAL_COMMUNITY): Payer: Self-pay

## 2021-01-27 NOTE — Telephone Encounter (Signed)
Patient called and stated that she wanted Dr. Adele Schilder to call her regarding other options other than the IOP program or going back to work. She stated that she can't do the IOP program due to cost. Writer spoke with Dr. Adele Schilder and relayed the message to him from the patient. He stated that since the patient can't do IOP, she will need to return back to work. Writer called the patient and informed her of this

## 2021-01-28 ENCOUNTER — Telehealth (HOSPITAL_COMMUNITY): Payer: Self-pay | Admitting: Psychiatry

## 2021-01-28 ENCOUNTER — Other Ambulatory Visit (HOSPITAL_BASED_OUTPATIENT_CLINIC_OR_DEPARTMENT_OTHER): Payer: Self-pay

## 2021-01-28 ENCOUNTER — Other Ambulatory Visit (HOSPITAL_COMMUNITY): Payer: Self-pay | Admitting: Psychiatry

## 2021-01-28 DIAGNOSIS — F330 Major depressive disorder, recurrent, mild: Secondary | ICD-10-CM

## 2021-01-28 DIAGNOSIS — F411 Generalized anxiety disorder: Secondary | ICD-10-CM

## 2021-01-28 MED FILL — Norgestimate & Ethinyl Estradiol Tab 0.25 MG-35 MCG: ORAL | 84 days supply | Qty: 84 | Fill #5 | Status: CN

## 2021-01-28 NOTE — Telephone Encounter (Signed)
D:  Returned pt's call but there was no answer.  Pt called case manager at 1230 today and left vm that she called her insurance company and she can afford to attend MH-IOP.  Pt is already scheduled to rtw on 01-31-21.  Dr. Adele Schilder, Damaris Schooner, CMA, and case manager Velva Harman) met briefly yesterday and discussed pt.  Dr. Adele Schilder inquired if pt had started MH-IOP on 01-24-21; as he had instructed; informed Dr. Adele Schilder of phone calls with pt, but she never started.  Dr. Adele Schilder instructions are still for pt to RTW as scheduled on 01-31-21.  A:  Placed call to pt; left vm of Dr. Adele Schilder instructing her to RTW as scheduled on 01-31-21.  Recommended pt to return and see how she does and if she needs to discuss further with Dr. Adele Schilder, he will return in one week (out on vacation next week).  Case manager called Pam to alert her of pt calling today.

## 2021-02-01 ENCOUNTER — Ambulatory Visit (INDEPENDENT_AMBULATORY_CARE_PROVIDER_SITE_OTHER): Payer: 59 | Admitting: Clinical

## 2021-02-01 ENCOUNTER — Other Ambulatory Visit: Payer: Self-pay

## 2021-02-01 DIAGNOSIS — F411 Generalized anxiety disorder: Secondary | ICD-10-CM | POA: Diagnosis not present

## 2021-02-01 DIAGNOSIS — F330 Major depressive disorder, recurrent, mild: Secondary | ICD-10-CM | POA: Diagnosis not present

## 2021-02-01 DIAGNOSIS — Z566 Other physical and mental strain related to work: Secondary | ICD-10-CM | POA: Diagnosis not present

## 2021-02-01 NOTE — Progress Notes (Signed)
   THERAPIST PROGRESS NOTE  Session Time: 11am  Participation Level: Active  Behavioral Response: NAAlertAnxious  Type of Therapy: Individual Therapy  Treatment Goals addressed: Anxiety and Coping  Interventions: CBT Virtual Visit via Telephone Note  I connected with Alla France on 02/01/21 at 11:00 AM EDT by telephone and verified that I am speaking with the correct person using two identifiers.  Location: Patient: home Provider: office   I discussed the limitations, risks, security and privacy concerns of performing an evaluation and management service by telephone and the availability of in person appointments. I also discussed with the patient that there may be a patient responsible charge related to this service. The patient expressed understanding and agreed to proceed.   I discussed the assessment and treatment plan with the patient. The patient was provided an opportunity to ask questions and all were answered. The patient agreed with the plan and demonstrated an understanding of the instructions.   The patient was advised to call back or seek an in-person evaluation if the symptoms worsen or if the condition fails to improve as anticipated.  I provided 42 minutes of non-face-to-face time during this encounter.  Summary: Pt reports she has been on FMLA for eight weeks and is scheduled to return to work today. Pt says she is unsure if she will go to work today due to stating experiencing heightened anxiety at work. Pt identifies work related stress as her greatest stressor. Per pt report, psychiatrist(Dr. Adele Schilder) made a referral for her to attend MH-IOP. Pt states she had concerns about the cost of attendance and this has delayed her admittance. Pt states she is still interested in participating in the group. According to pt, she has been accepted at [employer] where she plans to pursue an accounting degree. 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 Per pt report, her self care routine consists of shopping, taking her dog to the dog park and jogging. Pt says she has been inconsistent with her routine and attributes this to a "lack of motivation" Pt demonstrates some insight as she reports the consequence of having a lack of motivation has led to her "not feeling better" Pt says she has a strong support system consisting of family members, partner and friends.  Suicidal/Homicidal: Pt denies SI/HI no plan, intent or attempt to harm self or others reported.  Therapist Response: CSW continues to discuss with pt cognitive restructuring to challenge and change irrational behaviors. CSW processed with pt putting thoughts on trial using socratic questions to challenge irrational beliefs. CSW discussed with pt importance of being consistent and creating a routine of putting thoughts on trial to challenge automatic negative thoughts.  Plan: Return again in 2 weeks.  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

## 2021-02-03 ENCOUNTER — Other Ambulatory Visit (HOSPITAL_COMMUNITY): Payer: Self-pay | Admitting: Psychiatry

## 2021-02-03 ENCOUNTER — Other Ambulatory Visit (HOSPITAL_BASED_OUTPATIENT_CLINIC_OR_DEPARTMENT_OTHER): Payer: Self-pay

## 2021-02-03 ENCOUNTER — Telehealth (HOSPITAL_COMMUNITY): Payer: Self-pay | Admitting: Psychiatry

## 2021-02-03 DIAGNOSIS — F411 Generalized anxiety disorder: Secondary | ICD-10-CM

## 2021-02-03 DIAGNOSIS — F330 Major depressive disorder, recurrent, mild: Secondary | ICD-10-CM

## 2021-02-03 NOTE — Telephone Encounter (Signed)
D:  Pt had called Tammy Sours, LPN yesterday still requesting to start MH-IOP once Dr. Adele Schilder returns next week in the office.  According to pt, she never returned to work as Dr. Adele Schilder had instructed.  "I just felt that I was no better and couldn't return."  Case manager informed pt that MH-IOP will pick up on the day she starts the program (02-08-21).  Pt states the claim has ended, so it will be a new claim started.  A:  Instructed pt to have [employer] to send all forms that are needed to be completed.  Pt to start [place] on 02-08-21.  Informed Dr. Adele Schilder.  R:  Pt receptive.

## 2021-02-08 ENCOUNTER — Encounter (HOSPITAL_COMMUNITY): Payer: Self-pay | Admitting: Psychiatry

## 2021-02-08 ENCOUNTER — Other Ambulatory Visit (HOSPITAL_COMMUNITY): Payer: 59 | Attending: Psychiatry | Admitting: Psychiatry

## 2021-02-08 ENCOUNTER — Other Ambulatory Visit: Payer: Self-pay

## 2021-02-08 DIAGNOSIS — F330 Major depressive disorder, recurrent, mild: Secondary | ICD-10-CM | POA: Insufficient documentation

## 2021-02-08 DIAGNOSIS — F411 Generalized anxiety disorder: Secondary | ICD-10-CM | POA: Diagnosis not present

## 2021-02-08 NOTE — Progress Notes (Signed)
Virtual Visit via Video Note  I connected with Jennifer Levy on @TODAY @ at  9:00 AM EDT by a video enabled telemedicine application and verified that I am speaking with the correct person using two identifiers.  Location: Patient: at home Provider: at office   I discussed the limitations of evaluation and management by telemedicine and the availability of in person appointments. The patient expressed understanding and agreed to proceed.  I discussed the assessment and treatment plan with the patient. The patient was provided an opportunity to ask questions and all were answered. The patient agreed with the plan and demonstrated an understanding of the instructions.   The patient was advised to call back or seek an in-person evaluation if the symptoms worsen or if the condition fails to improve as anticipated.  I provided 20 minutes of non-face-to-face time during this encounter.   Dellia Nims, M.Ed,CNA   Patient ID: Jennifer Levy, female   DOB: March 09, 1991, 30 y.o.   MRN: [id number] As per previously stated in most recent CCA:  " Pt reports a history of depression and anxiety that she reports has worsened in the past 5 yrs. Pt reports sxs are making difficult for her to perform job duties. Pt denies a plan or intent to harm self or others. Pt reports previous hx of SI and eating disorder."  Pt started in [place] today.  Pt was previously referred per Dr. Adele Schilder about two weeks ago; but pt chose to continue visiting parents out of state and upon return called case manager back stating that she needed to verify her benefits first.  On a Friday, pt called case manager stating she wanted to start MH-IOP as soon as possible.  Pt was already scheduled to RTW on that following Monday.  According to pt, she never returned back to work.  FMLA paperwork was done; taking patient out of work beginning today (02-08-21) through 02-27-21.  RTW on 02-28-21; without any restrictions. Pt denies SI/HI or A/V  hallucinations.  A:  Oriented pt to MH-IOP.  Pt gave verbal consent for tx, to release chart information to referred providers and to complete any forms if needed.  Pt also gave consent for attending group virtually d/t COVID-19 social distancing restrictions.  Encouraged support groups.  F/U with Dr. Adele Schilder on 02-24-21 @ 9 a.m and Sanjuana Kava, LCSW.  R:  Pt receptive.  Dellia Nims, M.Ed,CNA
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
## 2021-02-08 NOTE — Progress Notes (Signed)
Virtual Visit via Video Note   I connected with Jennifer Levy on 02/08/21 at  9:00 AM EDT by a video enabled telemedicine application and verified that I am speaking with the correct person using two identifiers.   At orientation to the IOP program, Case Manager discussed the limitations of evaluation and management by telemedicine and the availability of in person appointments. The patient expressed understanding and agreed to proceed with virtual visits throughout the duration of the program.   Location:  Patient: Patient Home Provider: OPT [facility] Office   History of Present Illness: MDD and GAD   Observations/Objective: Check In: Case Manager checked in with all participants to review discharge dates, insurance authorizations, work-related documents and needs from the treatment team regarding medications. Jennifer Levy stated needs and engaged in discussion.    Initial Therapeutic Activity: Counselor facilitated a check-in with Jennifer Levy to assess for safety, sobriety and medication compliance.  Clinician also inquired about Jennifer Levy current emotional ratings, as well as any significant changes in thoughts, feelings or behavior since previous check in.  Jennifer Levy presented for session on time and was alert, oriented x5, with no evidence or self-report of SI/HI or A/V H.  Jennifer Levy reported compliance with medication and denied use of alcohol or illicit substances.  Jennifer Levy reported scores of 4/10 for depression, 7/10 for anxiety, and 0/10 for anger/irritability.  Jennifer Levy denied any recent panic attacks or outbursts.  Jennifer Levy reported that her most recent success was admission to [facility], since she found it difficult to make this decision at first.  Jennifer Levy reported that she has been struggling with a lack of coping skills, and hopes to acquire these through engagement in group.     Second Therapeutic Activity: Counselor introduced Jennifer Levy, Jennifer Levy to provide psychoeducation on topic of Grief and Loss  with members today.  Jennifer Levy began discussion by checking in with the group about their baseline mood today, general thoughts on what grief means to them and how it has affected them personally in the past.  Jennifer Levy provided information on how the process of grief/loss can differ depending upon one's unique culture, and categories of loss one could experience (i.e. loss of a person, animal, relationship, job, identity, etc).  Jennifer Levy encouraged members to be mindful of how pervasive loss can be, and how to recognize signs which could indicate that this is having an impact on one's overall mental health and wellbeing.  Intervention effectiveness could not be measured, as Jennifer Levy did not participate in this portion of group  Third Therapeutic Activity: Counselor covered topic of core beliefs with group today.  Counselor virtually shared a handout on the subject, which explained how everyone looks at the world differently, and two people can have the same experience, but have different interpretations of what happened.  Members were encouraged to think of these like sunglasses with different "shades" influencing perception towards positive or negative outcomes.  Examples of negative core beliefs were provided, such as "I'm unlovable", "I'm not good enough", and "I'm a bad person".  Members were asked to share which one(s) they could relate to, and then identify evidence which contradicts these beliefs.  Counselor also provided psychoeducation on positive affirmations today.  Counselor explained how these are positive statements which can be spoken out loud or recited mentally to challenge negative thoughts and/or core beliefs to improve mood and outlook each day.  Counselor shared a comprehensive list of affirmations virtually to members with different categories, including ones for health, confidence, success, and  happiness.  Counselor invited members to look through this list and identify any which resonated with them,  and practice saying them out loud with sincerity.  Interventions were effective, as evidenced by Jennifer Levy participating in discussion on these subjects, successfully identifying a negative core belief ("I am not good enough"), as well as evidence which contradicted this belief, such as her success in earning nursing degree, good job, and entering treatment to help her choose more reasonable standards of achievement for herself.  Jennifer Levy also expressed receptiveness to the positive affirmation that "Everything works out for the best possible good".      Assessment and Plan: Counselor recommends that Jennifer Levy remain in IOP treatment to better manage mental health symptoms, ensure stability and pursue completion of treatment plan goals. Counselor recommends adherence to crisis/safety plan, taking medications as prescribed, and following up with medical professionals if any issues arise.     Follow Up Instructions: Counselor will send Webex link for next session. Jennifer Levy was advised to call back or seek an in-person evaluation if the symptoms worsen or if the condition fails to improve as anticipated.     I provided 180 minutes of non-face-to-face time during this encounter.     Shade Flood, LCSW, LCAS 02/08/21

## 2021-02-09 ENCOUNTER — Other Ambulatory Visit (HOSPITAL_COMMUNITY): Payer: 59 | Admitting: Psychiatry

## 2021-02-09 ENCOUNTER — Telehealth (HOSPITAL_COMMUNITY): Payer: 59 | Admitting: Psychiatry

## 2021-02-09 ENCOUNTER — Other Ambulatory Visit: Payer: Self-pay

## 2021-02-09 DIAGNOSIS — F411 Generalized anxiety disorder: Secondary | ICD-10-CM

## 2021-02-09 DIAGNOSIS — F330 Major depressive disorder, recurrent, mild: Secondary | ICD-10-CM | POA: Diagnosis not present

## 2021-02-09 NOTE — Progress Notes (Signed)
Virtual Visit via Video Note   I connected with Kadence L. Egloff on 02/09/21 at  9:00 AM EDT by a video enabled telemedicine application and verified that I am speaking with the correct person using two identifiers.   At orientation to the IOP program, Case Manager discussed the limitations of evaluation and management by telemedicine and the availability of in person appointments. The patient expressed understanding and agreed to proceed with virtual visits throughout the duration of the program.   Location:  Patient: Patient Home Provider: [facility] [place] [facility]   History of Present Illness: MDD and GAD   Observations/Objective: Check In: Case Manager checked in with all participants to review discharge dates, insurance authorizations, work-related documents and needs from the treatment team regarding medications. Chelsei stated needs and engaged in discussion.    Initial Therapeutic Activity: Counselor facilitated a check-in with Yanai to assess for safety, sobriety and medication compliance.  Clinician also inquired about Krisha current emotional ratings, as well as any significant changes in thoughts, feelings or behavior since previous check in.  Royann presented for session on time and was alert, oriented x5, with no evidence or self-report of SI/HI or A/V H.  Brendia reported compliance with medication and denied use of alcohol or illicit substances.  Azarria reported scores of 4/10 for depression, 7/10 for anxiety, and 0/10 for anger/irritability.  Sister denied any recent panic attacks or outbursts.  Eleftheria reported that her most recent success was taking her dog for a 20 minute walk yesterday.  Quinn reported that her goal today will be to get out and get supplies for an apple pie, as she enjoys baking.     Second Therapeutic Activity: Counselor introduced Einar Grad, [employer] Pharmacist, to provide psychoeducation on topic of medication compliance with members today.  Jiles Garter provided psychoeducation  on classes of medications such as antidepressants, antipsychotics, what symptoms they are intended to treat, and any side effects one might encounter while on a particular prescription.  Time was allowed for clients to ask any questions they might have of [facility] regarding this specialty.  Intervention effectiveness could not be measured, as Sadee did not participate in this portion of group  Third Therapeutic Activity: Counselor discussed topic of gratitude journaling with members as a form of self-care.  Counselor virtually shared a handout with the group today which explained the benefits of this practice, including reduction in stress, increased happiness, and self-esteem.  Tips were also provided to aid in practice, such as taking time with entries, writing about people one is grateful for, and setting goal for two entries per week for at least 10-20 minutes at a time.  Counselor also provided group members with a variety of journaling prompts to choose from today, and encouraged each member to take time to write about something they are grateful for, with examples such as "Something beautiful I recently saw was..", "Something I can be proud of is.", "A reason to be excited for the future is." and more.  Members were encouraged to share their entry with the group, along with their perspective on the activity and motivation level towards making this a habit.   Interventions were effective, as evidenced by Emaree participating gratitude journaling activity and choosing prompt of "Someone that I can always rely on is.".  Suriya reported that this made her think of a close friend that flew down to see her from out of state and support her for 1 week when she experienced recent mental health crisis, stating "They  are a lifelong friend and I hope we can spend more time together around Thanksgiving".      Assessment and Plan: Counselor recommends that Inas remain in IOP treatment to better manage mental health  symptoms, ensure stability and pursue completion of treatment plan goals. Counselor recommends adherence to crisis/safety plan, taking medications as prescribed, and following up with medical professionals if any issues arise.     Follow Up Instructions: Counselor will send Webex link for next session. Shaletta was advised to call back or seek an in-person evaluation if the symptoms worsen or if the condition fails to improve as anticipated.     I provided 180 minutes of non-face-to-face time during this encounter.     Shade Flood, LCSW, LCAS 02/09/21

## 2021-02-10 ENCOUNTER — Other Ambulatory Visit (HOSPITAL_BASED_OUTPATIENT_CLINIC_OR_DEPARTMENT_OTHER): Payer: Self-pay

## 2021-02-10 ENCOUNTER — Other Ambulatory Visit (HOSPITAL_COMMUNITY): Payer: 59 | Admitting: Licensed Clinical Social Worker

## 2021-02-10 ENCOUNTER — Other Ambulatory Visit: Payer: Self-pay

## 2021-02-10 DIAGNOSIS — F330 Major depressive disorder, recurrent, mild: Secondary | ICD-10-CM

## 2021-02-10 DIAGNOSIS — F411 Generalized anxiety disorder: Secondary | ICD-10-CM

## 2021-02-10 NOTE — Progress Notes (Signed)
Virtual Visit via Video Note   I connected with Keyana L. Jehle on 02/10/21 at  9:00 AM EDT by a video enabled telemedicine application and verified that I am speaking with the correct person using two identifiers.   At orientation to the IOP program, Case Manager discussed the limitations of evaluation and management by telemedicine and the availability of in person appointments. The patient expressed understanding and agreed to proceed with virtual visits throughout the duration of the program.   Location:  Patient: Patient Home Provider: [facility] [place] [facility]   History of Present Illness: MDD and GAD   Observations/Objective: Check In: Case Manager checked in with all participants to review discharge dates, insurance authorizations, work-related documents and needs from the treatment team regarding medications. Shalayna stated needs and engaged in discussion.    Initial Therapeutic Activity: Counselor facilitated a check-in with Peri to assess for safety, sobriety and medication compliance.  Clinician also inquired about Doloras current emotional ratings, as well as any significant changes in thoughts, feelings or behavior since previous check in.  Jaynie presented for session on time and was alert, oriented x5, with no evidence or self-report of SI/HI or A/V H.  Dave reported that she ran out of medication, which has increased anxiety, and needs assistance with refill.  Counselor outreached case manager to assist with this matter.  Jaslyne denied use of alcohol or illicit substances.  Braelee reported scores of 4/10 for depression, 7/10 for anxiety, and 0/10 for anger/irritability.  Inga denied any recent panic attacks or outbursts.  Savonna reported that her most recent success was going to store yesterday, and buying supplies to make a pie, in addition to doing laundry and cleaning her bathroom.  Lova reported that her goal today will be to work on preparing an apple pie use these supplies.      Second Therapeutic Activity: Counselor engaged the group in discussion on managing work/life balance today to improve mental health and wellness.  Counselor explained how finding balance between responsibilities at home and work place can be challenging, lead to increased stress, and this has been further complicated by recent pandemic leading to unemployment, more virtual work, and blurring of lines between home as a place of rest or work duties.  Counselor facilitated discussion on what challenges members have faced with this issue historically, as well as what, if any, issues have arisen following pandemic.  Counselor also discussed strategies for improving work/life balance while members work on their mental health during treatment.  Some of these included keeping track of time management; creating a list of priorities and scaling importance; setting realistic, measurable goals each day; establishing boundaries; taking care of health needs; and nurturing relationships at home and work for support.  Counselor inquired about areas where members feel they are excelling, as well as areas they could focus on during treatment. Intervention was effective, as evidenced by Nysa reporting that she has been experiencing signs of burnout at her current job, including fatigue, headaches, decreased sleep, self-doubt, feeling overwhelmed, and increased isolation and procrastination.  Silvanna reported that these symptoms led her to take time off while she attempts to figure out the next steps she should take to identify a less stressful career which offers a healthier work life balance.  Kortne reported that one area of wellness she will try to focus on while she is out of work is improving physical health by setting goal to work out at least x2 per week.  Assessment and Plan: Counselor recommends that Hinda remain in IOP treatment to better manage mental health symptoms, ensure stability and pursue completion of  treatment plan goals. Counselor recommends adherence to crisis/safety plan, taking medications as prescribed, and following up with medical professionals if any issues arise.     Follow Up Instructions: Counselor will send Webex link for next session. Mozelle was advised to call back or seek an in-person evaluation if the symptoms worsen or if the condition fails to improve as anticipated.     I provided 180 minutes of non-face-to-face time during this encounter.     Shade Flood, LCSW, LCAS 02/10/21

## 2021-02-11 ENCOUNTER — Other Ambulatory Visit: Payer: Self-pay

## 2021-02-11 ENCOUNTER — Other Ambulatory Visit (HOSPITAL_COMMUNITY): Payer: 59 | Admitting: Licensed Clinical Social Worker

## 2021-02-11 ENCOUNTER — Other Ambulatory Visit (HOSPITAL_BASED_OUTPATIENT_CLINIC_OR_DEPARTMENT_OTHER): Payer: Self-pay

## 2021-02-11 ENCOUNTER — Other Ambulatory Visit (HOSPITAL_COMMUNITY): Payer: Self-pay | Admitting: Psychiatry

## 2021-02-11 DIAGNOSIS — F330 Major depressive disorder, recurrent, mild: Secondary | ICD-10-CM

## 2021-02-11 DIAGNOSIS — F411 Generalized anxiety disorder: Secondary | ICD-10-CM

## 2021-02-11 MED ORDER — CLONAZEPAM 0.5 MG PO TABS
0.5000 mg | ORAL_TABLET | Freq: Two times a day (BID) | ORAL | 0 refills | Status: AC | PRN
  Filled 2021-02-11: qty 60, 30d supply, fill #0

## 2021-02-11 MED ORDER — BUPROPION HCL ER (XL) 300 MG PO TB24
ORAL_TABLET | Freq: Every day | ORAL | 0 refills | Status: DC
  Filled 2021-02-11: qty 30, 30d supply, fill #0

## 2021-02-11 NOTE — Telephone Encounter (Signed)
Refill for Klonopin and Wellbutrin XL done

## 2021-02-11 NOTE — Progress Notes (Signed)
Virtual Visit via Video Note   I connected with Jennifer Levy on 02/11/21 at  9:00 AM EDT by a video enabled telemedicine application and verified that I am speaking with the correct person using two identifiers.   At orientation to the IOP program, Case Manager discussed the limitations of evaluation and management by telemedicine and the availability of in person appointments. The patient expressed understanding and agreed to proceed with virtual visits throughout the duration of the program.   Location:  Patient: Patient Home Provider: [facility] [place] [facility]   History of Present Illness: MDD and GAD   Observations/Objective: Check In: Case Manager checked in with all participants to review discharge dates, insurance authorizations, work-related documents and needs from the treatment team regarding medications. Jennifer Levy stated needs and engaged in discussion.    Initial Therapeutic Activity: Counselor facilitated a check-in with Jennifer Levy to assess for safety, sobriety and medication compliance.  Clinician also inquired about Smt. current emotional ratings, as well as any significant changes in thoughts, feelings or behavior since previous check in.  Jennifer Levy presented for session on time and was alert, oriented x5, with no evidence or self-report of SI/HI or A/V H.  Jennifer Levy reported that she ran out of medication, which has increased anxiety, and needs assistance with refill.  Counselor outreached case manager to assist with this matter.  Jennifer Levy denied use of alcohol or illicit substances.  Jennifer Levy reported scores of 3/10 for depression, 8/10 for anxiety, and 2/10 for anger/irritability.  Jennifer Levy denied any recent panic attacks or outbursts.  Jennifer Levy reported that her most recent success was successfully baking an apple pie yesterday, which turned out well.  Jennifer Levy reported that one struggle was having her work leave denied at her job, and thinking more about career transitions.       Second Therapeutic  Activity: Counselor introduced topic of self-esteem today and defined this as the value an individual places on oneself, based upon assessment of personal worth as a human being and approval/disapproval of one's behavior. Counselor asked members to assess their level of self-esteem at this time based upon common indicators of high self-esteem, including: accepting oneself unconditionally;  having self-respect and deep seated belief that one matters; being unaffected by other people's opinions/criticisms; and showing good control over emotions.  Counselor also explained concept of one's inner critic which serves to highlight faults and minimize strengths, directly influencing low sense of self-esteem.  Counselor then provided handout on 'strengths and qualities', which featured questions to guide discussion and increase awareness of each member's unique individual abilities which could reinforce higher self-esteem. Examples of questions included: 'things I am good at', 'challenges I have overcome', and 'what I like about myself'.  Interventions were effective, as evidenced by Jennifer Levy actively participating in discussion on this subject and engaging in activity, reporting initial self-esteem at 3/10, but successfully identifying personal strengths such as possessing forgiveness, time management and patience, with goals of improving in areas such as reducing comparison to other people, and perfectionism.  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  Jennifer Levy reported that her self-esteem rose to 6/10 following this discussion and activity.     Third Therapeutic Activity: Counselor concluded group by offering members the opportunity to engage in a voluntary guided imagery exercise.  Counselor informed members beforehand that they could discontinue activity at any  time if they  began to experience any negative emotional reaction.  Counselor guided members through process of getting comfortable, achieving relaxed breathing pattern, and then narrated 10 minute visualization of walking across a beach setting, including various pleasant sensory details to enhance experience (I.e. sand under foot, smell of salt in air, sound of crashing waves, etc).  Intervention was effective, as evidenced by Jennifer Levy successfully participating in activity and reporting that she enjoyed it and felt more relaxed as a result.     Assessment and Plan: Counselor recommends that Jennifer Levy remain in IOP treatment to better manage mental health symptoms, ensure stability and pursue completion of treatment plan goals. Counselor recommends adherence to crisis/safety plan, taking medications as prescribed, and following up with medical professionals if any issues arise.     Follow Up Instructions: Counselor will send Webex link for next session. Jennifer Levy was advised to call back or seek an in-person evaluation if the symptoms worsen or if the condition fails to improve as anticipated.     I provided 180 minutes of non-face-to-face time during this encounter.      Shade Flood, LCSW, LCAS 02/11/21

## 2021-02-14 ENCOUNTER — Other Ambulatory Visit (HOSPITAL_COMMUNITY): Payer: 59 | Admitting: Licensed Clinical Social Worker

## 2021-02-14 ENCOUNTER — Other Ambulatory Visit: Payer: Self-pay

## 2021-02-14 DIAGNOSIS — F411 Generalized anxiety disorder: Secondary | ICD-10-CM

## 2021-02-14 DIAGNOSIS — F330 Major depressive disorder, recurrent, mild: Secondary | ICD-10-CM

## 2021-02-14 NOTE — Progress Notes (Signed)
Virtual Visit via Video Note   I connected with Jennifer Levy on 02/14/21 at  9:00 AM EDT by a video enabled telemedicine application and verified that I am speaking with the correct person using two identifiers.   At orientation to the IOP program, Case Manager discussed the limitations of evaluation and management by telemedicine and the availability of in person appointments. The patient expressed understanding and agreed to proceed with virtual visits throughout the duration of the program.   Location:  Patient: Patient Home Provider: [facility] [place] [facility]   History of Present Illness: MDD and GAD   Observations/Objective: Check In: Case Manager checked in with all participants to review discharge dates, insurance authorizations, work-related documents and needs from the treatment team regarding medications. Jennifer Levy stated needs and engaged in discussion.    Initial Therapeutic Activity: Counselor facilitated a check-in with Jennifer Levy to assess for safety, sobriety and medication compliance.  Clinician also inquired about Jennifer Levy current emotional ratings, as well as any significant changes in thoughts, feelings or behavior since previous check in.  Jennifer Levy presented for session on time and was alert, oriented x5, with no evidence or self-report of SI/HI or A/V H.  Jennifer Levy reported that she ran out of medication, which has increased anxiety, and needs assistance with refill.  Counselor outreached case manager to assist with this matter.  Jennifer Levy denied use of alcohol or illicit substances.  Jennifer Levy reported scores of 6/10 for depression, 5/10 for anxiety, and 0/10 for anger/irritability.  Jennifer Levy denied any recent panic attacks or outbursts.  Jennifer Levy reported that her most recent success was quitting her job, stating "It felt really good".  Jennifer Levy reported that her plan this week will be to update her resume and begin looking into new job opportunities.  Jennifer Levy reported that her struggle is finding the right  job that suits her skills, but she will talk to friends for support in this transition.       Second Therapeutic Activity: Counselor introduced topic of building social support network today.  Counselor explained how this can be defined as having a having a group of healthy people in one's life you can talk to, spend time with, and get help from to improve both mental and physical health.  Counselor noted that some barriers can make it difficult to connect with other people, including the presence of anxiety or depression, or moving to an unfamiliar area.  Group members were asked to assess the current state of their support network, and identify ways that this could be improved.  Tips were given on how to address previously noted barriers, such as strengthening social skills, using relaxation techniques to reduce anxiety, scheduling social time each week, and/or exploring social events nearby which could increase chances of meeting new supports.  Members were also encouraged to consider getting closer to people they already know through suggestions such as outreaching someone by text, email or phone call if they haven't spoken in awhile, doing something nice for a friend/family member unexpectedly, and/or inviting someone over for a game/movie/dinner night.  Interventions were effective, as evidenced by Jennifer Levy actively participating in discussion on this subject and reporting that she considers a strong support network to consist of having close friends around that you enjoy spending time with and can depend upon.  Jennifer Levy reported that she does have a goal of strengthening her support network during treatment by meeting new people, and would accomplish this by going to various social settings like the dog park once per  week for a few hours in order to interact with others who appear to have similar interests.  Jennifer Levy reported that she would also make an effort to put away her phone during social interactions like  this, since she sometimes uses this as an escape from socialization.    Assessment and Plan: Counselor recommends that Jennifer Levy remain in IOP treatment to better manage mental health symptoms, ensure stability and pursue completion of treatment plan goals. Counselor recommends adherence to crisis/safety plan, taking medications as prescribed, and following up with medical professionals if any issues arise.     Follow Up Instructions: Counselor will send Webex link for next session. Jennifer Levy was advised to call back or seek an in-person evaluation if the symptoms worsen or if the condition fails to improve as anticipated.     I provided 180 minutes of non-face-to-face time during this encounter.      Shade Flood, LCSW, LCAS 02/14/21

## 2021-02-15 ENCOUNTER — Other Ambulatory Visit (HOSPITAL_BASED_OUTPATIENT_CLINIC_OR_DEPARTMENT_OTHER): Payer: Self-pay

## 2021-02-15 ENCOUNTER — Other Ambulatory Visit: Payer: Self-pay

## 2021-02-15 ENCOUNTER — Telehealth (HOSPITAL_COMMUNITY): Payer: Self-pay | Admitting: Psychiatry

## 2021-02-15 ENCOUNTER — Other Ambulatory Visit (HOSPITAL_COMMUNITY): Payer: 59 | Admitting: Psychiatry

## 2021-02-15 DIAGNOSIS — F330 Major depressive disorder, recurrent, mild: Secondary | ICD-10-CM

## 2021-02-15 DIAGNOSIS — F411 Generalized anxiety disorder: Secondary | ICD-10-CM

## 2021-02-15 MED FILL — Norgestimate & Ethinyl Estradiol Tab 0.25 MG-35 MCG: ORAL | 28 days supply | Qty: 28 | Fill #5 | Status: AC

## 2021-02-15 NOTE — Telephone Encounter (Addendum)
D:  Group leader Shade Flood, [place]) informed case manager that pt stated that she took four Xanax and drunk a bottle of wine yesterday.  A:  Placed call to discuss yesterday with pt.  Pt states it wasn't a suicide attempt.  "It was just a lot going on a home yesterday and I was just trying to cope.  I was lonely."  Discussed at length positive coping skills with pt and safety options.  Inquired if pt felt she needed to be hospitalized at [facility]; pt declined stating that she wouldn't do that again.  Voiced understanding of safety plan.  Provided pt with suicide prevention #.  Strongly recommended that patient shouldn't be alone at home at this time and someone else should keep her medications.  Pt currently denies SI.  Informed pt that case manager will be alerting Ricky Ala, NP, Dr. Adele Schilder and Sanjuana Kava, LCSW of what happened yesterday. R:  Pt receptive.

## 2021-02-15 NOTE — Progress Notes (Signed)
Virtual Visit via Video Note   I connected with Jennifer Levy on 02/15/21 at  9:00 AM EDT by a video enabled telemedicine application and verified that I am speaking with the correct person using two identifiers.   At orientation to the IOP program, Case Manager discussed the limitations of evaluation and management by telemedicine and the availability of in person appointments. The patient expressed understanding and agreed to proceed with virtual visits throughout the duration of the program.   Location:  Patient: Patient Home Provider: [facility]   History of Present Illness: MDD and GAD   Observations/Objective: Check In: Case Manager checked in with all participants to review discharge dates, insurance authorizations, work-related documents and needs from the treatment team regarding medications. Jennifer Levy stated needs and engaged in discussion.    Initial Therapeutic Activity: Counselor facilitated a check-in with Jennifer Levy to assess for safety, sobriety and medication compliance.  Clinician also inquired about Jennifer Levy current emotional ratings, as well as any significant changes in thoughts, feelings or behavior since previous check in.  Jennifer Levy presented for session on time and was alert, oriented x5, with no evidence or self-report of SI/HI or A/V H.  Jennifer Levy denied use of illicit substances.  Jennifer Levy reported scores of 7/10 for depression, 8/10 for anxiety, and 0/10 for anger/irritability.  Jennifer Levy denied any recent panic attacks or outbursts.  Jennifer Levy reported that her most recent success was getting out of the house to take her dog to a training class yesterday.  Jennifer Levy reported that she was feeling lonely yesterday, and drank 1 bottle of wine and took 4 Xanax to cope.  Counselor provided psychoeducation on the risks involved in mixing these substances, and encouraged Jennifer Levy to reach out to supports when feeling lonely to avoid reliance upon substances to cope. Jennifer Levy was receptive to  suggestions and noted that she hasn't drank this quantity of alcohol in several months and would try to expand support network via engagement in community support programs through [employer].  Counselor informed case Freight forwarder and NP of medication misuse.     Second Therapeutic Activity: Counselor introduced [employer], [place] Chaplain to provide psychoeducation on topic of Grief and Loss with members today.  Jennifer Levy began discussion by checking in with the group about their baseline mood today, general thoughts on what grief means to them and how it has affected them personally in the past.  Jennifer Levy provided information on how the process of grief/loss can differ depending upon one's unique culture, and categories of loss one could experience (i.e. loss of a person, animal, relationship, job, identity, etc).  Jennifer Levy encouraged members to be mindful of how pervasive loss can be, and how to recognize signs which could indicate that this is having an impact on one's overall mental health and wellbeing.  Intervention was effective, as evidenced by Jennifer Levy participating in discussion with speaker on the subject, and reporting that she is actively processing the loss of a job she had put great effort into succeeding at, and one aspect of her life that brings her joy and helps combat difficult feelings related to this grief is spending time with her two nieces.     Third Therapeutic Activity: Counselor introduced topic of creating mental health maintenance plan today.  Counselor provided handout on subject to members, which stressed the importance of maintaining one's mental health in a similar way to using diet and exercise to ensure physical health.  Counselor walked members through process of identifying triggers which could  worsen symptoms, including specific people, places, and things one needs to avoid.  Members were also tasked with identifying warning signs such as thoughts, feelings, or behaviors  which could indicate mental health is at increased risk.  Counselor also facilitated conversation on self-care activities and coping strategies which members have previously utilized in the past, are currently using in daily routine, or plan to use soon to assist with managing problems or symptoms when/if they appear.  Counselor encouraged members to revisit their maintenance plan often and make changes as needed to ensure day to day stability.  Intervention was effective, as evidenced by Jennifer Levy successfully competing mental health maintenance plan, which included identifying mental health risks such as isolating more often, engaging in negative self-talk, and spending less time on hobbies, in addition to preventative steps she can take to counter these issues, such as going outside to take walks, and reaching out to supports when struggling.  Jennifer Levy reported that she would seek readmission to [facility] in the future if her coping skills no longer proved effective in managing symptoms of depression or anxiety.    Assessment and Plan: Counselor recommends that Jennifer Levy remain in IOP treatment to better manage mental health symptoms, ensure stability and pursue completion of treatment plan goals. Counselor recommends adherence to crisis/safety plan, taking medications as prescribed, and following up with medical professionals if any issues arise.     Follow Up Instructions: Counselor will send Webex link for next session. Jennifer Levy was advised to call back or seek an in-person evaluation if the symptoms worsen or if the condition fails to improve as anticipated.     I provided 180 minutes of non-face-to-face time during this encounter.     Shade Flood, LCSW, LCAS 02/15/21

## 2021-02-16 ENCOUNTER — Other Ambulatory Visit (HOSPITAL_COMMUNITY): Payer: 59 | Admitting: Licensed Clinical Social Worker

## 2021-02-16 ENCOUNTER — Other Ambulatory Visit: Payer: Self-pay

## 2021-02-16 DIAGNOSIS — F330 Major depressive disorder, recurrent, mild: Secondary | ICD-10-CM

## 2021-02-16 DIAGNOSIS — F411 Generalized anxiety disorder: Secondary | ICD-10-CM | POA: Diagnosis not present

## 2021-02-16 NOTE — Progress Notes (Signed)
Virtual Visit via Video Note   I connected with Jennifer Levy on 02/16/21 at  9:00 AM EDT by a video enabled telemedicine application and verified that I am speaking with the correct person using two identifiers.   At orientation to the IOP program, Case Manager discussed the limitations of evaluation and management by telemedicine and the availability of in person appointments. The patient expressed understanding and agreed to proceed with virtual visits throughout the duration of the program.   Location:  Patient: Patient Home Provider: [facility] [place] [facility]   History of Present Illness: MDD and GAD   Observations/Objective: Check In: Case Manager checked in with all participants to review discharge dates, insurance authorizations, work-related documents and needs from the treatment team regarding medications. Jennifer Levy stated needs and engaged in discussion.    Initial Therapeutic Activity: Counselor facilitated a check-in with Jennifer Levy to assess for safety, sobriety and medication compliance.  Clinician also inquired about Jennifer Levy current emotional ratings, as well as any significant changes in thoughts, feelings or behavior since previous check in.  Jennifer Levy presented for session on time and was alert, oriented x5, with no evidence or self-report of SI/HI or A/V H.  Jennifer Levy reported compliance with medication and denied any use of alcohol or illicit substances since last check-in.  Jennifer Levy reported scores of 6/10 for depression, 5/10 for anxiety, and 0/10 for anger/irritability.  Jennifer Levy denied any recent panic attacks or outbursts.  Jennifer Levy reported that her most recent success was cleaning the kitchen yesterday.  Jennifer Levy reported that her present struggle is dealing with some low energy this morning.      Second Therapeutic Activity: Clinician continued discussion upon topic of self-care today with group members.  Clinician virtually shared self-care assessment form with members via Webex to complete final  sub-categories (i.e. social, spiritual, and professional).  Members were asked to rank their engagement in the activities listed for each dimension on a scale of 1-3, with 1 indicating 'Poor', 2 indicating 'Ok', and 3 indicating 'Well'.  Clinician invited members to share results of this assessment, and inquired about which areas of self-care they are doing well in, as well as areas that require attention, and how they plan to begin addressing this during treatment.  Intervention was effective, as evidenced by Jennifer Levy successfully completing final sections of assessment and actively engaging in discussion on subject, reporting that she is excelling in areas such as calling or writing friends/family that live far away, spending intimate time with partner, acting in accordance with morals and values, learning new things related to profession, and keeping a comfortable workspace, but would benefit from focusing more on areas such as meeting new people, engaging in enjoyable activities with supports, spending time in nature, recognizing things that give meaning to life, taking breaks at work, and maintaining better balance between personal and professional life.  Jennifer Levy reported that she would work to improve self-care deficits by trying to meet new people through dog park visits, and participation in [place] classes, in addition to [place] to increase exposure to nature, find a job that allows for lunch breaks without interruption, and avoid checking work emails from home to improve balance.       Third Therapeutic Activity: Clinician offered to teach group members an ACT relaxation technique today to aid in managing difficult thoughts, feelings, urges, and sensations.  Clinician guided members through process of getting comfortable, achieving relaxing breathing rhythm, and then maintaining this throughout activity.  Clinician  invited members to imagine a gently flowing stream in  their mind with leaves floating upon it, and when any thoughts, feelings, urges, or sensations arose, good or bad, they were instructed to visualize placing them on these passing leaves over course of 10 minutes practice.  Intervention was effective, as evidenced by Jennifer Levy successfully participating in activity and reporting that she enjoyed it, although it was hard to focus at times due to anxiety.  Jennifer Levy reported that she would practice it to improve efficacy.    Assessment and Plan: Counselor recommends that Jennifer Levy remain in IOP treatment to better manage mental health symptoms, ensure stability and pursue completion of treatment plan goals. Counselor recommends adherence to crisis/safety plan, taking medications as prescribed, and following up with medical professionals if any issues arise.     Follow Up Instructions: Counselor will send Webex link for next session. Jennifer Levy was advised to call back or seek an in-person evaluation if the symptoms worsen or if the condition fails to improve as anticipated.     I provided 180 minutes of non-face-to-face time during this encounter.     Shade Flood, LCSW, LCAS 02/16/21

## 2021-02-17 ENCOUNTER — Other Ambulatory Visit: Payer: Self-pay

## 2021-02-17 ENCOUNTER — Other Ambulatory Visit (HOSPITAL_COMMUNITY): Payer: 59 | Admitting: Licensed Clinical Social Worker

## 2021-02-17 DIAGNOSIS — F330 Major depressive disorder, recurrent, mild: Secondary | ICD-10-CM

## 2021-02-17 DIAGNOSIS — F411 Generalized anxiety disorder: Secondary | ICD-10-CM

## 2021-02-17 NOTE — Progress Notes (Signed)
Virtual Visit via Video Note   I connected with Jennifer Levy on 02/17/21 at  9:00 AM EDT by a video enabled telemedicine application and verified that I am speaking with the correct person using two identifiers.   At orientation to the IOP program, Case Manager discussed the limitations of evaluation and management by telemedicine and the availability of in person appointments. The patient expressed understanding and agreed to proceed with virtual visits throughout the duration of the program.   Location:  Patient: Patient Home Provider: [facility] [place] [facility]   History of Present Illness: MDD and GAD   Observations/Objective: Check In: Case Manager checked in with all participants to review discharge dates, insurance authorizations, work-related documents and needs from the treatment team regarding medications. Jennifer Levy stated needs and engaged in discussion.    Initial Therapeutic Activity: Counselor facilitated a check-in with Jennifer Levy to assess for safety, sobriety and medication compliance.  Clinician also inquired about Jennifer Levy current emotional ratings, as well as any significant changes in thoughts, feelings or behavior since previous check in.  Jennifer Levy presented for session on time and was alert, oriented x5, with no evidence or self-report of SI/HI or A/V H.  Jennifer Levy reported compliance with medication and denied any use of alcohol or illicit substances since last check-in.  Jennifer Levy reported scores of 4/10 for depression, 7/10 for anxiety, and 0/10 for anger/irritability.  Jennifer Levy denied any recent panic attacks or outbursts.  Jennifer Levy denied any recent successes.  Jennifer Levy reported that her present struggle is experiencing heightened anxiety regarding her job search and losing insurance coverage before the end of the month.      Second Therapeutic Activity: Counselor introduced topic of assertive communication today.  Counselor shared various handouts with members virtually in group to read along with  on the subject.  These handouts defined assertive communication as a communication style in which a person stands up for their own needs and wants, while also taking into consideration the needs and wants of others, without behaving in a passive or aggressive way.  Traits of assertive communicators were highlighted such as using appropriate speaking volume, maintaining eye contact, using confident language, and avoiding interruption.  Members were also provided with tips on how to improve communication, including respecting oneself, expressing thoughts and feelings calmly, and saying "No" when necessary.  Members were given a variety of scenarios where they could practice using these tips to respond in an assertive manner.  Intervention was effective, as evidenced by Jennifer Levy successfully participating in discussion on the topic and reporting that she has a passive communication style due to traits such as being soft spoken, lacking confidence, prioritizing needs of others over her own, and lacking eye contact.  Jennifer Levy reported that she can be assertive at times when interacting with peers that are supportive of her, encourage her to be open about her feelings, and considers quitting her job recently to be a step towards focusing more on her own needs and wellbeing.  Jennifer Levy also actively participated in several roleplay activities in order to improve her assertive communication skills today.    Assessment and Plan: Counselor recommends that Jennifer Levy remain in IOP treatment to better manage mental health symptoms, ensure stability and pursue completion of treatment plan goals. Counselor recommends adherence to crisis/safety plan, taking medications as prescribed, and following up with medical professionals if any issues arise.     Follow Up Instructions: Counselor will send Webex link for next session. Jennifer Levy was advised to call back or seek an  in-person evaluation if the symptoms worsen or if the condition fails  to improve as anticipated.     I provided 180 minutes of non-face-to-face time during this encounter.     Shade Flood, LCSW, LCAS 02/17/21

## 2021-02-18 ENCOUNTER — Other Ambulatory Visit (HOSPITAL_COMMUNITY): Payer: 59 | Admitting: Licensed Clinical Social Worker

## 2021-02-18 ENCOUNTER — Other Ambulatory Visit: Payer: Self-pay

## 2021-02-18 DIAGNOSIS — F411 Generalized anxiety disorder: Secondary | ICD-10-CM

## 2021-02-18 DIAGNOSIS — F330 Major depressive disorder, recurrent, mild: Secondary | ICD-10-CM | POA: Diagnosis not present

## 2021-02-18 NOTE — Progress Notes (Addendum)
Virtual Visit via Video Note   I connected with Jennifer Levy on 02/18/21 at  9:00 AM EDT by a video enabled telemedicine application and verified that I am speaking with the correct person using two identifiers.   At orientation to the IOP program, Case Manager discussed the limitations of evaluation and management by telemedicine and the availability of in person appointments. The patient expressed understanding and agreed to proceed with virtual visits throughout the duration of the program.   Location:  Patient: Patient Home Provider: [facility] [place] [facility]   History of Present Illness: MDD and GAD   Observations/Objective: Check In: Case Manager checked in with all participants to review discharge dates, insurance authorizations, work-related documents and needs from the treatment team regarding medications. Jennifer Levy stated needs and engaged in discussion.    Initial Therapeutic Activity: Counselor facilitated a check-in with Jennifer Levy to assess for safety, sobriety and medication compliance.  Clinician also inquired about Jennifer Levy current emotional ratings, as well as any significant changes in thoughts, feelings or behavior since previous check in.  Jennifer Levy presented for session on time and was alert, oriented x5, with no evidence or self-report of SI/HI or A/V H.  Jennifer Levy reported compliance with medication and denied any use of alcohol or illicit substances since last check-in.  Jennifer Levy reported scores of 6/10 for depression, 4/10 for anxiety, and 0/10 for anger/irritability.  Jennifer Levy denied any recent panic attacks or outbursts.  Jennifer Levy reported that a recent positive was having her boyfriend visit for a few days before he goes back to work.  Jennifer Levy denied any present struggle.      Second Therapeutic Activity: Psycho-educational portion of group was provided by [name], Mudlogger of community education with [employer].  Jennifer Levy provided information on history of her local agency,  mission statement, and the variety of unique services offered which group members might find beneficial to engage in, including both virtual and in-person support groups, as well as peer support program for mentoring.  Jennifer Levy offered time to answer member's questions regarding services and encouraged them to consider utilizing these services to assist in working towards their individual wellness goals.  Intervention was effective, as evidenced by Jennifer Levy following along with speaker's presentation, and expressing interest in attending one of the support groups after upcoming discharge from [name].   Third Therapeutic Activity: Counselor introduced topic of grounding skills today.  Counselor defined these as simple strategies one can use to help detach from difficult thoughts or feelings temporarily by focusing on something else.  Counselor noted that grounding will not solve the problem at hand, but can provide the practitioner with time to regain control over their thoughts and/or feelings and prevent the situation from getting worse (i.e. interrupting a panic attack).  Counselor divided these into three categories (mental, physical, and soothing) and then provided examples of each which group members could practice during session.  Some of these included describing one's environment in detail or playing a categories game with oneself for mental category, taking a hot bath/shower, stretching, or carrying a grounding object for physical category, and saying kind statements, or visualizing people one cares about for soothing category.  Counselor inquired about which techniques members have used with success in the past, or will commit to learning, practicing, and applying now to improve coping abilities.  Intervention was effective, as evidenced by Jennifer Levy successfully participating in discussion on grounding skills, practicing some of these during session, and expressing receptiveness to several offered, such as  using her  imagination to picture a walk up a mountain trail, counting to 10, eating something in a savoring way, or using the ring on her hand as a grounding object to fidget with when needed.     Assessment and Plan: Counselor recommends that Jennifer Levy remain in IOP treatment to better manage mental health symptoms, ensure stability and pursue completion of treatment plan goals. Counselor recommends adherence to crisis/safety plan, taking medications as prescribed, and following up with medical professionals if any issues arise.     Follow Up Instructions: Counselor will send Webex link for next session. Jennifer Levy was advised to call back or seek an in-person evaluation if the symptoms worsen or if the condition fails to improve as anticipated.     I provided 150 minutes of non-face-to-face time during this encounter.     Shade Flood, [place], LCAS 02/18/21

## 2021-02-21 ENCOUNTER — Telehealth (HOSPITAL_COMMUNITY): Payer: Self-pay | Admitting: Psychiatry

## 2021-02-22 ENCOUNTER — Other Ambulatory Visit (HOSPITAL_COMMUNITY): Payer: 59 | Admitting: Family

## 2021-02-22 DIAGNOSIS — F330 Major depressive disorder, recurrent, mild: Secondary | ICD-10-CM | POA: Diagnosis not present

## 2021-02-22 DIAGNOSIS — F411 Generalized anxiety disorder: Secondary | ICD-10-CM | POA: Diagnosis not present

## 2021-02-22 NOTE — Progress Notes (Signed)
Virtual Visit via Video Note   I connected with Jennifer Levy on 02/22/21 at  9:00 AM EDT by a video enabled telemedicine application and verified that I am speaking with the correct person using two identifiers.   At orientation to the IOP program, Case Manager discussed the limitations of evaluation and management by telemedicine and the availability of in person appointments. The patient expressed understanding and agreed to proceed with virtual visits throughout the duration of the program.   Location:  Patient: Patient Home Provider: [facility]   History of Present Illness: MDD and GAD   Observations/Objective: Check In: Case Manager checked in with all participants to review discharge dates, insurance authorizations, work-related documents and needs from the treatment team regarding medications. Jennifer Levy stated needs and engaged in discussion.    Initial Therapeutic Activity: Counselor facilitated a check-in with Jennifer Levy to assess for safety, sobriety and medication compliance.  Clinician also inquired about Jennifer Levy current emotional ratings, as well as any significant changes in thoughts, feelings or behavior since previous check in.  Jennifer Levy presented for session on time and was alert, oriented x5, with no evidence or self-report of SI/HI or A/V H.  Jennifer Levy reported compliance with medication and denied any use of alcohol or illicit substances since last check-in.  Jennifer Levy reported scores of 3/10 for depression, 5/10 for anxiety, and 0/10 for anger/irritability.  Jennifer Levy denied any recent panic attacks or outbursts.  Jennifer Levy reported that a recent success was [place].  Jennifer Levy denied any present struggle, stating "I'm feeling pretty good today".      Second Therapeutic Activity: Counselor provided psychoeducation on subject of boundaries with group members today using a virtual handout.  This handout defined boundaries as the limits and  rules that we set for ourselves within relationships, and featured a breakdown of the 3 common categories of boundaries (i.e. porous, rigid, and healthy), along with typical traits specific to each one for easy identification.  It was noted that most people have a mixture of different boundary types depending on setting, person, and culture.  Additional information was provided on the types of boundaries (i.e. physical, intellectual, emotional, sexual, material, and time) within relationships, and what could be considered healthy versus unhealthy. Counselor tasked members with identifying what types of boundaries they presently hold within her own support systems, the collective impact these boundaries have upon their mental health, and changes that could be made in order to more effectively communicate individual mental health needs.  Intervention was effective, as evidenced by Jennifer Levy successfully participating in discussion on subject and reporting that she primarily has rigid boundaries due to traits such as maintaining few close relationships, being protective of her personal information, appearing detached at times, and keeping others at a distance, although her boundaries can be porous depending on the person/setting, since she may have difficulty saying "No" to requests, and show dependence upon another person's opinion.  Jennifer Levy reported that healthy boundary traits include not compromising her own values for others, and accepting when others say "No".  Jennifer Levy reported that she tends to have rigid boundaries around strangers, porous ones around close friends/family, and healthy ones with her current partner, but one major change she made within her support network was moving away from her mother to put distance between them, as the mother would not respect Jennifer Levy's boundary for time she attempted to reinforce.  Jennifer Levy reported increased motivation to adjust her boundaries to be  less rigid in order to meet new  people and increase available supports, stating "I need to change them up a bit, and become more assertive about my boundaries too".       Third Therapeutic Activity: Counselor introduced Jennifer Levy, Surveyor, quantity of [place] and [facility]ol[facility] with [facility] to provide psychoeducation on topic of Grief and Loss with members today.  Jennifer Levy began discussion by checking in with the group about their baseline mood today, general thoughts on what grief means to them and how it has affected them personally in the past.  Jennifer Levy provided information on how the process of grief/loss can differ depending upon one's unique culture, and categories of loss one could experience (i.e. loss of a person, animal, relationship, job, identity, etc).  Jennifer Levy encouraged members to be mindful of how pervasive loss can be, and how to recognize signs which could indicate that this is having an impact on one's overall mental health and wellbeing.  Intervention was effective, as evidenced by Jennifer Levy participating in discussion with speaker on the subject, reporting that the experience of grief and loss for her can present as feelings of anger or denial, and a goal for her is learning how to open up more with supports and loosen emotional boundary in order to promote personal healing and work towards reaching a state of acceptance with challenging events from her past she hasn't fully processed yet.    Assessment and Plan: Counselor recommends that Jennifer Levy remain in IOP treatment to better manage mental health symptoms, ensure stability and pursue completion of treatment plan goals. Counselor recommends adherence to crisis/safety plan, taking medications as prescribed, and following up with medical professionals if any issues arise.     Follow Up Instructions: Counselor will send Webex link for next session. Jennifer Levy was advised to call back or seek an in-person evaluation if the symptoms worsen or if the condition fails to  improve as anticipated.     I provided 180 minutes of non-face-to-face time during this encounter.     Shade Flood, LCSW, LCAS 02/22/21

## 2021-02-23 ENCOUNTER — Other Ambulatory Visit (HOSPITAL_COMMUNITY): Payer: 59 | Admitting: Psychiatry

## 2021-02-23 ENCOUNTER — Other Ambulatory Visit: Payer: Self-pay

## 2021-02-23 DIAGNOSIS — F411 Generalized anxiety disorder: Secondary | ICD-10-CM | POA: Diagnosis not present

## 2021-02-23 DIAGNOSIS — F330 Major depressive disorder, recurrent, mild: Secondary | ICD-10-CM

## 2021-02-23 NOTE — Progress Notes (Signed)
Virtual Visit via Video Note   I connected with Madina L. Hauter on 02/23/21 at  9:00 AM EDT by a video enabled telemedicine application and verified that I am speaking with the correct person using two identifiers.   At orientation to the IOP program, Case Manager discussed the limitations of evaluation and management by telemedicine and the availability of in person appointments. The patient expressed understanding and agreed to proceed with virtual visits throughout the duration of the program.   Location:  Patient: Patient Home Provider: [facility]   History of Present Illness: MDD and GAD   Observations/Objective: Check In: Case Manager checked in with all participants to review discharge dates, insurance authorizations, work-related documents and needs from the treatment team regarding medications. Kima stated needs and engaged in discussion.    Initial Therapeutic Activity: Counselor facilitated a check-in with Zuriah to assess for safety, sobriety and medication compliance.  Clinician also inquired about Annalyn current emotional ratings, as well as any significant changes in thoughts, feelings or behavior since previous check in.  Christl presented for session on time and was alert, oriented x5, with no evidence or self-report of SI/HI or A/V H.  Khiana reported compliance with medication and denied any use of alcohol or illicit substances since last check-in.  Leilah reported scores of 3/10 for depression, 6/10 for anxiety, and 0/10 for anger/irritability.  Santrice denied any recent panic attacks or outbursts.  Sholonda reported that a recent success was getting outside the home to take the dog for a walk yesterday.  Celese denied any present struggle, and reported that she has several productivity goals today, including getting groceries, visiting the post office, and going to the dog park to try and connect with new people.     Second Therapeutic Activity: Counselor introduced Einar Grad, [employer] Pharmacist, to provide psychoeducation on topic of medication compliance with members today.  Jiles Garter provided psychoeducation on classes of medications such as antidepressants, antipsychotics, what symptoms they are intended to treat, and any side effects one might encounter while on a particular prescription.  Time was allowed for clients to ask any questions they might have of [facility] regarding this specialty.  Intervention was effective, as evidenced by Keni participating in discussion with speaker on the subject, reporting that she is concerned about the ineffectiveness of current medication regimen in addressing her anxiety severity. Alyona was receptive to feedback from pharmacist regarding need to discuss medication options with prescribing doctor tailored towards addressing this additional issue, as current regimen does not take this problem into consideration.     Third Therapeutic Activity: Counselor introduced Oleta Mouse, [employer], to provide psychoeducation on topic of nutrition with members today.  Anda Kraft virtually shared a comprehensive PowerPoint presentation to guide discussion, which featured the various components of healthy living that influence one's wellbeing, including practicing mindfulness, staying physically active, calm in mood, well-rested, and more.  Anda Kraft explained how good nutrition reinforces positive physical and mental health, and shared a video which explained how food intake affects brain functioning in particular.  Anda Kraft provided advice on how to adjust diet in order to promote wellbeing during course of treatment, including achieving balanced daily intake along with regular exercise.  Anda Kraft offered the 'plate method' as a tool for proper distribution of protein, grains, starches, vegetables, fruit, and low calorie drink, in addition to concept of 'mindful eating', and covering current Dietary Guidelines for Americans for more tips.  Anda Kraft inquired about changes  members would like to make  to their nutrition in order to increase overall wellbeing based upon information shared today, and time was allowed to ask any questions they might have of Anda Kraft regarding her specialty.  Intervention effectiveness could not be measured, as evidenced by Buffey was observed paying attention to speaker presentation, but did not actively participate in discussion with the group or speaker on the subject.    Assessment and Plan: Counselor recommends that Scotti remain in IOP treatment to better manage mental health symptoms, ensure stability and pursue completion of treatment plan goals. Counselor recommends adherence to crisis/safety plan, taking medications as prescribed, and following up with medical professionals if any issues arise.     Follow Up Instructions: Counselor will send Webex link for next session. Tyshay was advised to call back or seek an in-person evaluation if the symptoms worsen or if the condition fails to improve as anticipated.     I provided 180 minutes of non-face-to-face time during this encounter.     Shade Flood, [place], LCAS 02/23/21

## 2021-02-24 ENCOUNTER — Telehealth (HOSPITAL_COMMUNITY): Payer: 59 | Admitting: Psychiatry

## 2021-02-24 ENCOUNTER — Other Ambulatory Visit: Payer: Self-pay

## 2021-02-24 ENCOUNTER — Other Ambulatory Visit (HOSPITAL_COMMUNITY): Payer: 59 | Admitting: Licensed Clinical Social Worker

## 2021-02-24 DIAGNOSIS — F330 Major depressive disorder, recurrent, mild: Secondary | ICD-10-CM

## 2021-02-24 DIAGNOSIS — F411 Generalized anxiety disorder: Secondary | ICD-10-CM | POA: Diagnosis not present

## 2021-02-24 NOTE — Patient Instructions (Signed)
D:  Patient completed MH-IOP today.  A:  Discharge today.  Follow up with Dr. Adele Schilder on 02-25-21 @ 9 a.m(virtual) and Sanjuana Kava, LCSW on 02-28-21 @ 9 a.m.  Strongly encouraged support groups through [facility] [phone].  [facility] [phone]) can assist with finding a job and they have support groups also.  [place] dial [phone].  Providers on a sliding scale:  Marinus Maw, [facility] (therapist) [phone]; [facility] [phone] (therapist) and [facility] [phone](psychiatrist and therapist).  R:  Patient receptive.

## 2021-02-24 NOTE — Progress Notes (Addendum)
Virtual Visit via Video Note  I connected with Jennifer Levy on @TODAY @ at  9:00 AM EDT by a video enabled telemedicine application and verified that I am speaking with the correct person using two identifiers.  Location: Patient: at home Provider: at office   I discussed the limitations of evaluation and management by telemedicine and the availability of in person appointments. The patient expressed understanding and agreed to proceed.  I discussed the assessment and treatment plan with the patient. The patient was provided an opportunity to ask questions and all were answered. The patient agreed with the plan and demonstrated an understanding of the instructions.   The patient was advised to call back or seek an in-person evaluation if the symptoms worsen or if the condition fails to improve as anticipated.  I provided 20 minutes of non-face-to-face time during this encounter.   Dellia Nims, M.Ed,CNA   Patient ID: Jennifer Levy, female   DOB: [birth year], 28 y.o.   MRN: [id number] As per previously stated in most recent [facility]:  " Pt reports a history of depression and anxiety that she reports has worsened in the past 5 yrs. Pt reports sxs are making difficult for her to perform job duties. Pt denies a plan or intent to harm self or others. Pt reports previous hx of SI and eating disorder."   Pt started in [place] today.  Pt was previously referred per Dr. Adele Schilder about two weeks ago; but pt chose to continue visiting parents out of state and upon return called case manager back stating that she needed to verify her benefits first.  On a Friday, pt called case manager stating she wanted to start MH-IOP as soon as possible.  Pt was already scheduled to RTW on that following Monday.  According to pt, she never returned back to work.  FMLA paperwork was done; taking patient out of work beginning today (02-08-21) through 02-27-21.  RTW on 02-28-21; without any restrictions. Pt denies SI/HI or  A/V hallucinations.    Pt completed all scheduled days in MH-IOP as of today.  Reports feeling overall better.  Still c/o of a lot of anxiety.  "I don't know what I'm going to do about a job and not having insurance."  Pt states her current insurance runs out on 03-07-21.  Discussed the option of cobra with pt.  Also discussed the option of [place] services [phone]) d/t no insurance after the 31st.  "I will just pay self pay until I find a job or get insurance."   On a scaled of 1-10 (10 being the worst); pt rates her anxiety at a 7 and depression at a 3.  Reports that the groups were very helpful.  "It was good having people to talk and relate to.  Tommi Rumps provided a lot of great information."  Pt denies SI/HI or A/V hallucinations.  Denies any further incidences of misusing the Xanax and drinking a bottle of wine.  Pt states she would like to get back into running 3-5 miles.  "I just need to motivate myself." A:  D/C today.  F/U with Dr. Adele Schilder on 02-25-21 @ 9a.m (virtual) and Sanjuana Kava, LCSW on 02-28-21 @ 9 a.m (virtual).  Strongly recommended support groups through [facility] [phone].  Will provide patient with [employer] [phone] (assist with employment and support groups also); discussed pt calling [phone] if ever in a mental health crisis; provided pt with sliding scale providers (ie. Marinus Maw, [facility] [phone]), [facility]  for therapy ([phone]) and [facility] for therapy and med mgmt [phone]).  R:  Patient receptive.  Dellia Nims, M.Ed,CNA

## 2021-02-24 NOTE — Progress Notes (Signed)
Virtual Visit via Video Note   I connected with Kelsy L. Insco on 02/24/21 at  9:00 AM EDT by a video enabled telemedicine application and verified that I am speaking with the correct person using two identifiers.   At orientation to the IOP program, Case Manager discussed the limitations of evaluation and management by telemedicine and the availability of in person appointments. The patient expressed understanding and agreed to proceed with virtual visits throughout the duration of the program.   Location:  Patient: Patient Home Provider: [facility]   History of Present Illness: MDD and GAD   Observations/Objective: Check In: Case Manager checked in with all participants to review discharge dates, insurance authorizations, work-related documents and needs from the treatment team regarding medications. Detta stated needs and engaged in discussion.    Initial Therapeutic Activity: Counselor facilitated a check-in with Yui to assess for safety, sobriety and medication compliance.  Clinician also inquired about Othelia current emotional ratings, as well as any significant changes in thoughts, feelings or behavior since previous check in.  Deyanna presented for session on time and was alert, oriented x5, with no evidence or self-report of SI/HI or A/V H.  Nashonda reported compliance with medication and denied any use of alcohol or illicit substances since last check-in.  Cheris reported scores of 2/10 for depression, 5/10 for anxiety, and 0/10 for anger/irritability.  Nimrat denied any recent panic attacks or outbursts.  Teliyah reported that a recent success was running errands yesterday and meeting someone new at the dog park.  Marshelle denied any present struggle, and reported that she feels ready for discharge today.       Second Therapeutic Activity: Counselor introduced topic of stress management today.  Counselor provided definition of stress as feeling tense, overwhelmed, worn out, and/or  exhausted, and noted that in small amounts, stress can be motivating until things become too overwhelming to manage.  Counselor also explained how stress can be acute (brief but intense) or chronic (long-lasting) and this can impact the severity of symptoms one can experience in the physical, emotional, and behavioral categories.  Counselor inquired about members' specific stressors, how long they have been prevalent, and the various symptoms that tend to manifest as a result.  Counselor also offered several stress management strategies to help improve members' coping ability, including journaling, gratitude practice, relaxation techniques, and time management tips.  Counselor also explained that research has shown a strong support network composed of trusted family, friends, or community members can increase resilience in times of stress, and inquired about who members can reach out to for help in managing stressors.  Counselor encouraged members to consider discussing stressor 'red flags' with their close supports that can be monitored and strategies for assisting them in times of crisis.  Intervention was effective, as evidenced by Raygan actively participating in discussion on the subject, and reporting that dealing with chronic stress from working at her previous job was a primary reason for seeking admission into [facility].  Dayja reported that she also experiences stress related to financial security, and some warning signs of excessive stress to be mindful of include chronic headaches, muscle tension, nausea, fatigue, and feeling hopeless.  Mellany reported that she intends to improve stress management approach by continuing with individual therapy upon discharge from [facility] today, being more open with her support system for help when needed, begin working out again regularly to ensure stress outlet, and practice coping skills learned from group such as deep breathing, and chair  yoga.     Assessment and  Plan: Mckaylin informed case manager and counselor that she is ready to end group treatment and discharged successfully following todays session.  Rukaya has been recommended to continue making regular appointments with her primary counselor, psychiatrist, and medical providers following this transition to ensure consistent monitoring of mental health symptoms, adherence to crisis/safety plan, and medication compliance.     Follow Up Instructions: Zacari was advised to call back or seek an in-person evaluation if the symptoms worsen or if the condition fails to improve as anticipated.     I provided 180 minutes of non-face-to-face time during this encounter.     Shade Flood, LCSW, LCAS 02/24/21

## 2021-02-25 ENCOUNTER — Telehealth (HOSPITAL_BASED_OUTPATIENT_CLINIC_OR_DEPARTMENT_OTHER): Payer: 59 | Admitting: Psychiatry

## 2021-02-25 ENCOUNTER — Other Ambulatory Visit (HOSPITAL_BASED_OUTPATIENT_CLINIC_OR_DEPARTMENT_OTHER): Payer: Self-pay

## 2021-02-25 ENCOUNTER — Encounter (HOSPITAL_COMMUNITY): Payer: Self-pay | Admitting: Family

## 2021-02-25 ENCOUNTER — Encounter (HOSPITAL_COMMUNITY): Payer: Self-pay | Admitting: Psychiatry

## 2021-02-25 ENCOUNTER — Other Ambulatory Visit: Payer: Self-pay

## 2021-02-25 VITALS — Wt 155.0 lb

## 2021-02-25 DIAGNOSIS — F509 Eating disorder, unspecified: Secondary | ICD-10-CM | POA: Diagnosis not present

## 2021-02-25 DIAGNOSIS — F330 Major depressive disorder, recurrent, mild: Secondary | ICD-10-CM

## 2021-02-25 DIAGNOSIS — F0630 Mood disorder due to known physiological condition, unspecified: Secondary | ICD-10-CM | POA: Diagnosis not present

## 2021-02-25 DIAGNOSIS — F419 Anxiety disorder, unspecified: Secondary | ICD-10-CM | POA: Diagnosis not present

## 2021-02-25 MED ORDER — CARIPRAZINE HCL 4.5 MG PO CAPS
1.0000 | ORAL_CAPSULE | Freq: Every day | ORAL | 0 refills | Status: AC
  Filled 2021-02-25 (×2): qty 90, 90d supply, fill #0

## 2021-02-25 MED ORDER — BUPROPION HCL ER (XL) 300 MG PO TB24
ORAL_TABLET | Freq: Every day | ORAL | 1 refills | Status: AC
  Filled 2021-02-25: qty 30, fill #0
  Filled 2021-02-25: qty 30, 30d supply, fill #0

## 2021-02-25 MED ORDER — GABAPENTIN 100 MG PO CAPS
100.0000 mg | ORAL_CAPSULE | Freq: Three times a day (TID) | ORAL | 0 refills | Status: AC | PRN
Start: 2021-02-25 — End: 2022-02-25
  Filled 2021-02-25: qty 90, 30d supply, fill #0

## 2021-02-25 MED ORDER — TRAZODONE HCL 100 MG PO TABS
ORAL_TABLET | ORAL | 1 refills | Status: AC
  Filled 2021-02-25: qty 30, 30d supply, fill #0
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
## 2021-02-25 NOTE — Progress Notes (Addendum)
  [facility] Discharge Summary  Jennifer Levy [id number]  Admission date: 02/08/2021 Discharge date: 02/24/2021  Reason for admission: Per admission assessment note: Jennifer Levy is a 30 year old female that presents due to worsening depression and anxiety.  She reports she was referred by her psychiatrist MD Arfeen.  She reports multiple stressors which is causing worsening anxiety and depression symptoms. Stated her main stressor is because "my job can be a bit much."  Reports she has been prescribed multiple medications to help with her anxiety however she does not feel the medications have been infected.  She is currently taking Klonopin and trazodone.  Chart review BuSpar, was recently discontinued. SSRIs tried and failed.  Patient reports she continues to struggle with feeling overwhelmed mood irritability, anxiousness and depression.  Progress in Program Toward Treatment Goals: Ongoing, patient attended and participated with daily group session with active and engaged participation.  Denies suicidal or homicidal ideations or previous assessment.  Patient is is to follow-up with attending psychiatrist.  Medication was refilled 2 weeks prior.  Progress (rationale): Keep follow-up with Dr. Adele Schilder on 02/25/2021 and therapist Sanjuana Kava 02/28/2021  Take all medications as prescribed. Keep all follow-up appointments as scheduled.  Do not consume alcohol or use illegal drugs while on prescription medications. Report any adverse effects from your medications to your primary care provider promptly.  In the event of recurrent symptoms or worsening symptoms, call 911, a crisis hotline, or go to the nearest emergency department for evaluation.    Derrill Center, NP 02/25/2021

## 2021-02-28 ENCOUNTER — Other Ambulatory Visit: Payer: Self-pay

## 2021-02-28 ENCOUNTER — Ambulatory Visit (INDEPENDENT_AMBULATORY_CARE_PROVIDER_SITE_OTHER): Payer: 59 | Admitting: Clinical

## 2021-02-28 DIAGNOSIS — F330 Major depressive disorder, recurrent, mild: Secondary | ICD-10-CM

## 2021-02-28 DIAGNOSIS — F411 Generalized anxiety disorder: Secondary | ICD-10-CM | POA: Diagnosis not present

## 2021-02-28 NOTE — Progress Notes (Signed)
   THERAPIST PROGRESS NOTE  Session Time: 9am  Participation Level: Active  Behavioral Response: NAAlert"pretty good"  Type of Therapy: Individual Therapy  Treatment Goals addressed: Coping  Interventions: Other: psychoeducation Virtual Visit via Telephone Note  I connected with Jennifer Levy on 02/28/21 at  9:00 AM EDT by telephone and verified that I am speaking with the correct person using two identifiers.  Location: Patient: home Provider: office   I discussed the limitations, risks, security and privacy concerns of performing an evaluation and management service by telephone and the availability of in person appointments. I also discussed with the patient that there may be a patient responsible charge related to this service. The patient expressed understanding and agreed to proceed.   I discussed the assessment and treatment plan with the patient. The patient was provided an opportunity to ask questions and all were answered. The patient agreed with the plan and demonstrated an understanding of the instructions.   The patient was advised to call back or seek an in-person evaluation if the symptoms worsen or if the condition fails to improve as anticipated.  I provided 45 minutes of non-face-to-face time during this encounter.  Summary: Pt reports she is feeling "pretty good" today. Pt says she completed [facility] last week and found the group helpful. Also, pt says she recently quit her job and says stress level has decreased significantly but states she now is concerned about her finances. Pt says she plans to take some time for herself before looking for a new job. Pt discussed increase in depression and anxiety symptoms when her partner travels for business. Pt states experiencing loneliness and sadness. Per pt report, she copes by taking her dog to the dog park, talking to friends, reading or watching TV. Pt rates depression and anxiety 4/10(10=highest level sxs). According to  pt, when she was employed she experienced feeling "overwhelmed, burnout, increased anxiety, irritability and headaches" Pt states many of these sxs have subsided since no longer being employed. Suicidal/Homicidal: Pt denies any current SI/HI no plan, intent or attempt to harm self or others reported. Pt says she has experienced passive SI but no attempts to harm self reported. Pt states to avoid harming self she speaks to someone she can confide in or find a healthy distraction. According to pt, she was provided with a list of crisis resources from [facility], agreeable to use them if necessary. Pt also encouraged to call 911 or go to ED.  Therapist Response: CSW assessed for changes in mood, behavior and daily functioning. CSW reviewed and provided pt with information on stress management tips (acute vs chronic stress, warning signs, how to build resilience) Processed with pt benefits of positive journaling and encouraged to practice  Plan: Return again in 2 weeks.  Diagnosis: Axis I: generalized anxiety disorder                                     Mild episode of recurrent major depressive d/o    Axis II: No diagnosis    Yvette Rack, LCSW 02/28/2021

## 2021-04-11 ENCOUNTER — Telehealth (HOSPITAL_COMMUNITY): Payer: 59 | Admitting: Psychiatry

## 2021-06-06 ENCOUNTER — Telehealth (HOSPITAL_COMMUNITY): Payer: Self-pay | Admitting: *Deleted

## 2021-06-06 NOTE — Telephone Encounter (Signed)
VM left for pt regarding scheduling a f/u appointment with Dr. Adele Schilder as last scheduled visit was a no show. Pt encouraged to call office if she wants to reschedule.

## 2022-03-25 IMAGING — US US PELVIS COMPLETE WITH TRANSVAGINAL
1 series · 14 of 25 positions shown · non-contrast
Comparison: None

CLINICAL DATA: Initial evaluation for secondary amenorrhea.



[Series 1: us pelvis complete with transvaginal · 0.22mm/px · 14 of 102 slices shown]
[im 1/102]
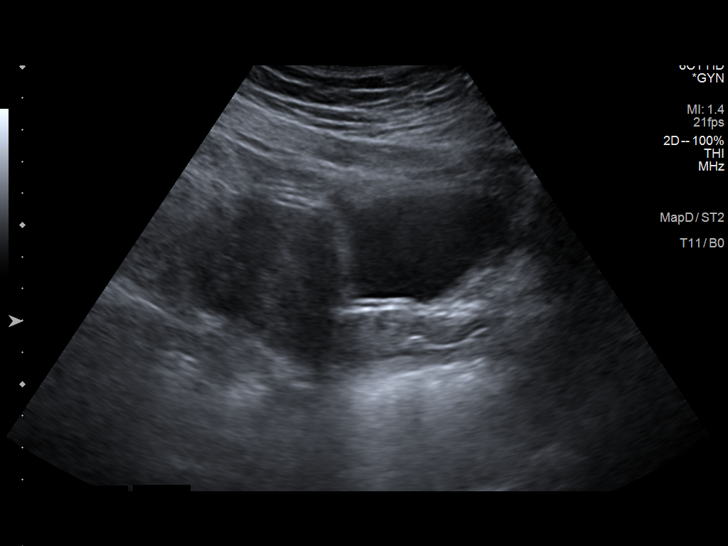
[im 9/102]
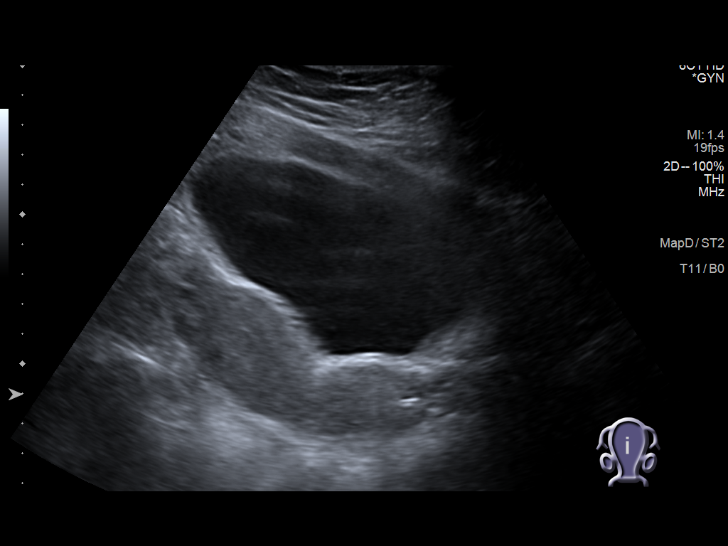
[im 17/102]
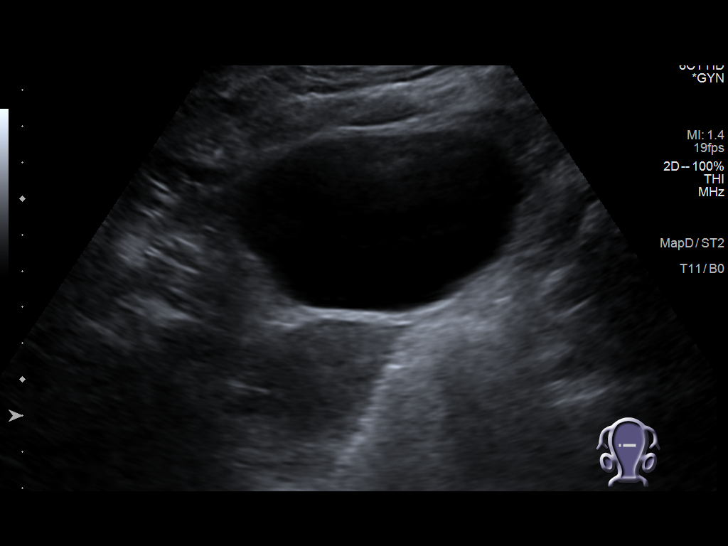
[im 26/102]
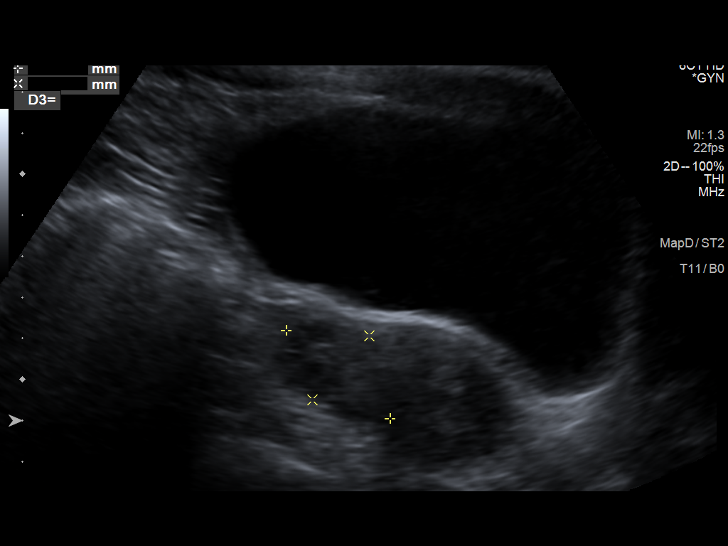
[im 34/102]
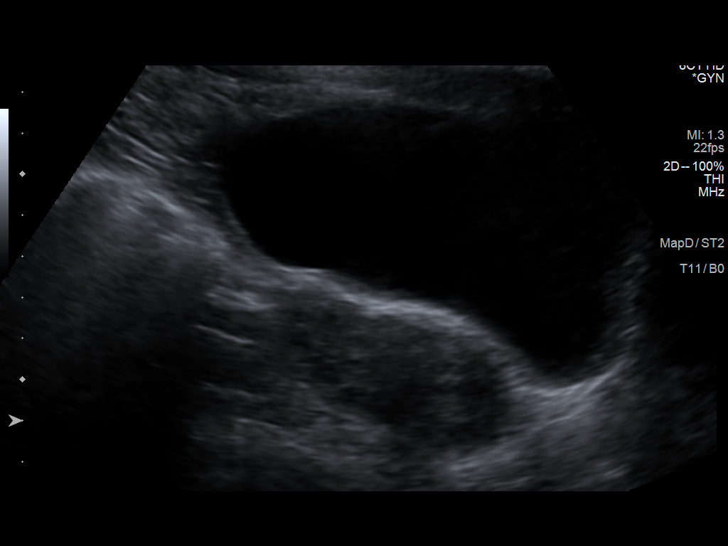
[im 38/102]
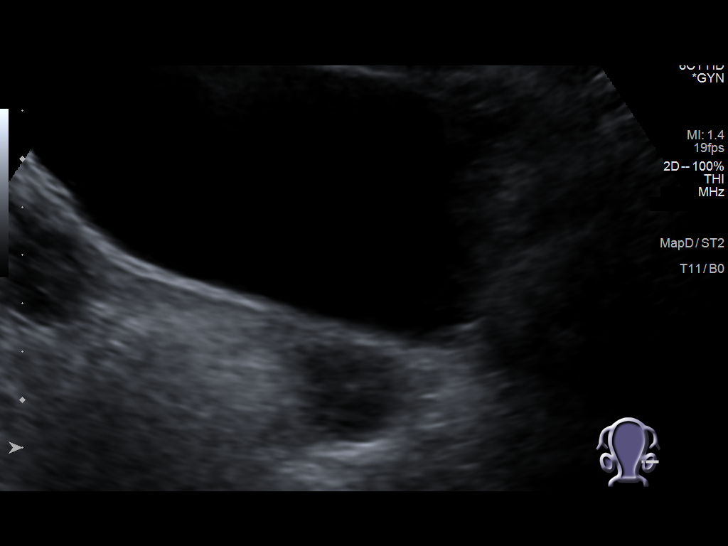
[im 47/102]
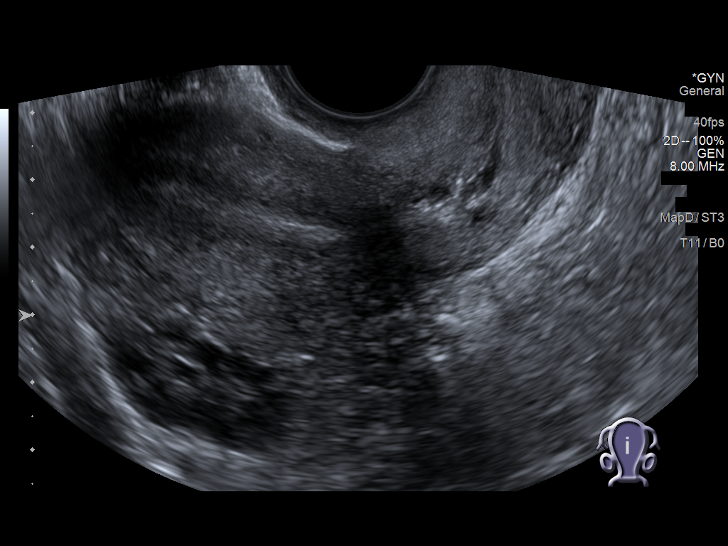
[im 55/102]
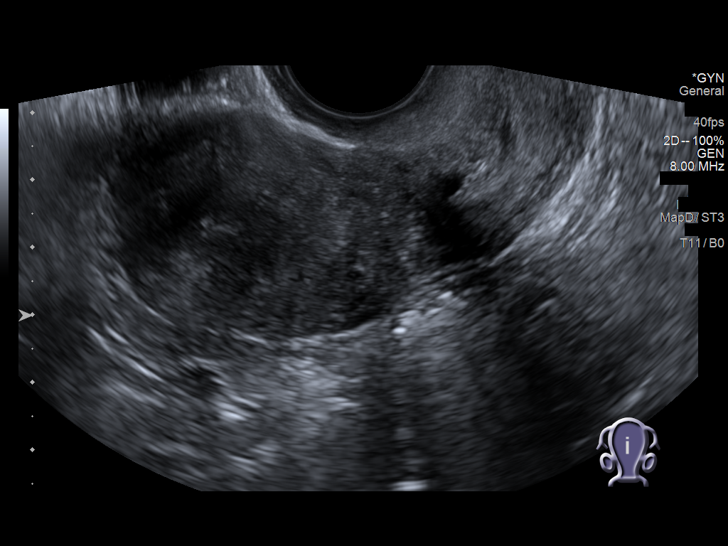
[im 64/102]
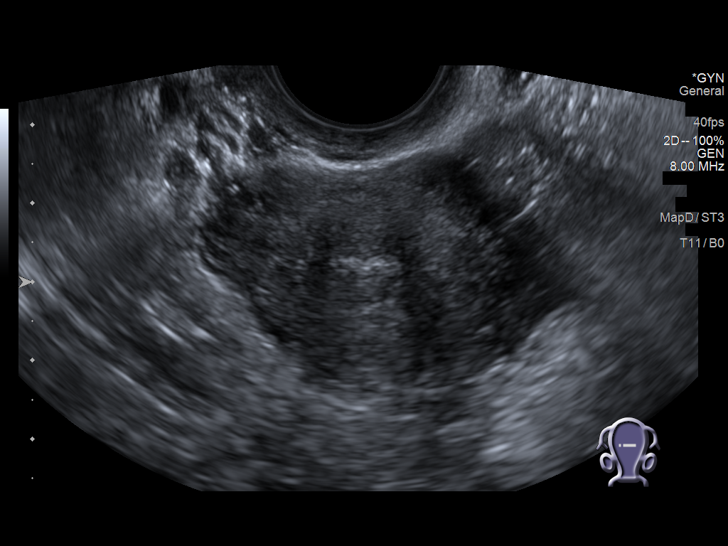
[im 68/102]
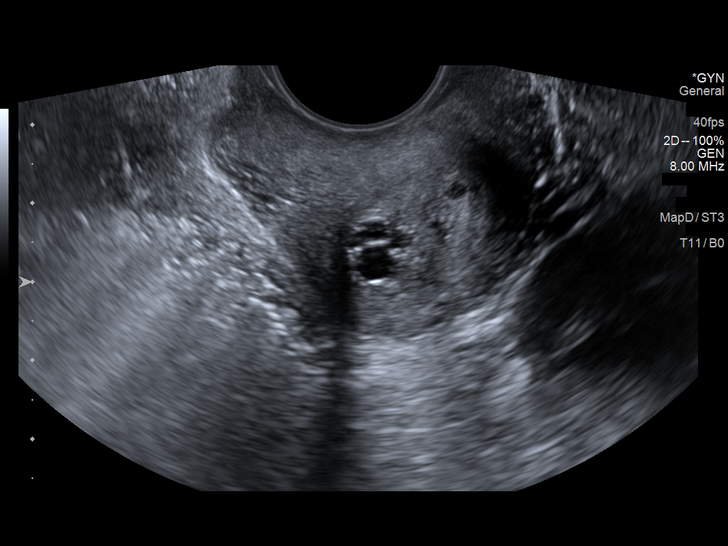
[im 76/102]
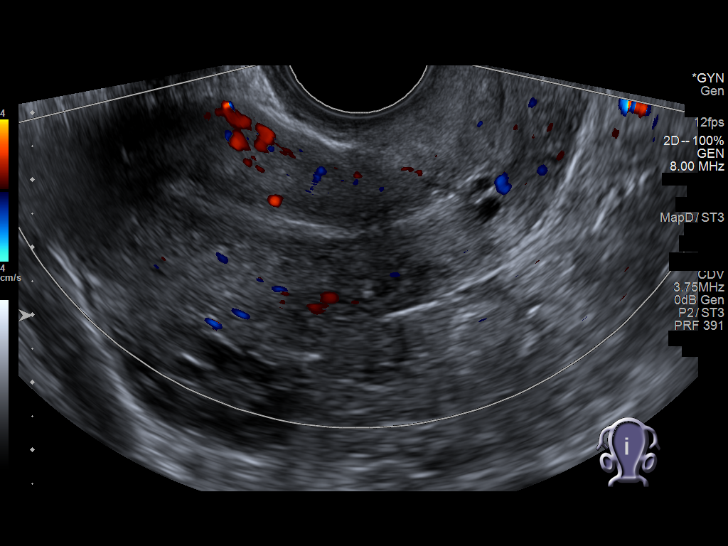
[im 85/102]
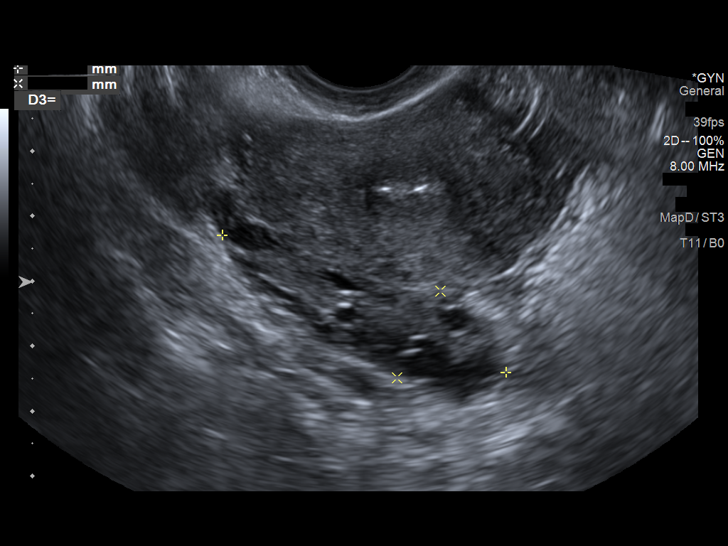
[im 93/102]
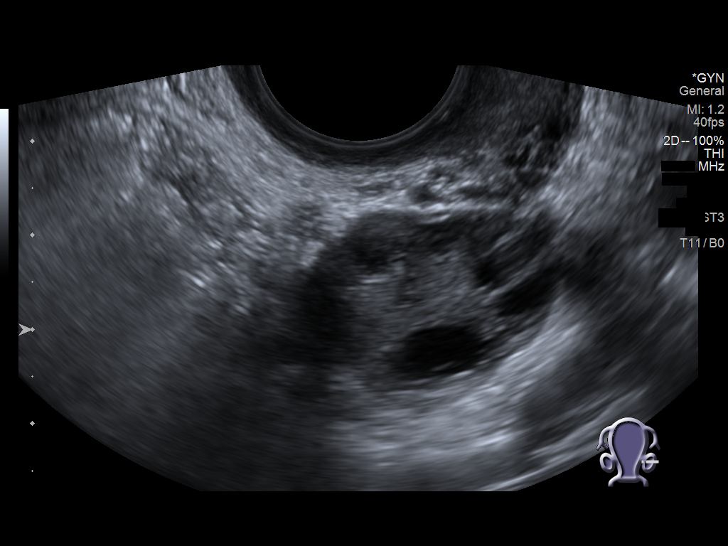
[im 102/102]
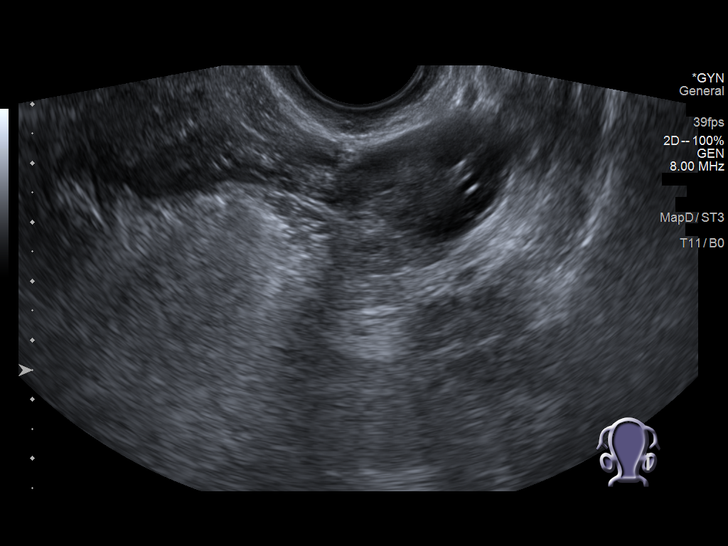

[14 of 25 positions shown; findings below may reference images not displayed]

FINDINGS: Uterus

Measurements: 8.0 x 3.2 x 4.2 cm = volume: 57 mL. Uterus is
anteverted. No discrete fibroid or other mass. Multiple nabothian
cysts noted about the cervix.

Endometrium

Thickness: 3.5 mm.  No focal abnormality visualized.

Right ovary

Measurements: 4.9 x 1.5 x 3.0 cm = volume: 11.0 mL. Normal
appearance/no adnexal mass.

Left ovary

Measurements: 3.5 x 2.0 x 2.7 cm = volume: 10.0 mL. Normal
appearance/no adnexal mass.

Other findings

No abnormal free fluid.
IMPRESSION: Normal pelvic ultrasound.
